# Patient Record
Sex: Male | Born: 2000
Health system: Southern US, Community
[De-identification: ages and names within clinical notes are randomized; demographics above are authoritative.]

## PROBLEM LIST (undated history)

## (undated) DIAGNOSIS — R6252 Short stature (child): Secondary | ICD-10-CM

## (undated) DIAGNOSIS — Z9109 Other allergy status, other than to drugs and biological substances: Secondary | ICD-10-CM

## (undated) DIAGNOSIS — E0500 Thyrotoxicosis with diffuse goiter without thyrotoxic crisis or storm: Secondary | ICD-10-CM

## (undated) DIAGNOSIS — F909 Attention-deficit hyperactivity disorder, unspecified type: Secondary | ICD-10-CM

## (undated) DIAGNOSIS — J45909 Unspecified asthma, uncomplicated: Secondary | ICD-10-CM

## (undated) DIAGNOSIS — E063 Autoimmune thyroiditis: Secondary | ICD-10-CM

## (undated) DIAGNOSIS — R000 Tachycardia, unspecified: Secondary | ICD-10-CM

## (undated) HISTORY — DX: Attention-deficit hyperactivity disorder, unspecified type: F90.9

## (undated) HISTORY — DX: Short stature (child): R62.52

## (undated) HISTORY — DX: Tachycardia, unspecified: R00.0

## (undated) HISTORY — DX: Thyrotoxicosis with diffuse goiter without thyrotoxic crisis or storm: E05.00

## (undated) HISTORY — DX: Unspecified asthma, uncomplicated: J45.909

## (undated) HISTORY — DX: Autoimmune thyroiditis: E06.3

## (undated) HISTORY — PX: OTHER SURGICAL HISTORY: SHX169

## (undated) HISTORY — DX: Other allergy status, other than to drugs and biological substances: Z91.09

---
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
## 2019-01-16 ENCOUNTER — Telehealth (INDEPENDENT_AMBULATORY_CARE_PROVIDER_SITE_OTHER): Payer: Self-pay | Admitting: "Endocrinology

## 2019-01-16 NOTE — Telephone Encounter (Signed)
  Who's calling (name and relationship to patient) : Self  Best contact number: [phone]  Provider they see: Fransico Michael  Reason for call: Would like to talk with Dr. Fransico Michael regarding his diagnosis and possibly joining the [employer].      PRESCRIPTION REFILL ONLY  Name of prescription:  Pharmacy:

## 2019-01-16 NOTE — Telephone Encounter (Signed)
Routed to provider

## 2019-01-23 ENCOUNTER — Encounter (INDEPENDENT_AMBULATORY_CARE_PROVIDER_SITE_OTHER): Payer: Self-pay | Admitting: "Endocrinology

## 2019-01-23 ENCOUNTER — Other Ambulatory Visit: Payer: Self-pay

## 2019-01-23 ENCOUNTER — Ambulatory Visit (INDEPENDENT_AMBULATORY_CARE_PROVIDER_SITE_OTHER): Payer: Medicaid Other | Admitting: "Endocrinology

## 2019-01-23 VITALS — BP 134/88 | HR 98 | Resp 20 | Ht 71.65 in | Wt 253.6 lb

## 2019-01-23 DIAGNOSIS — I10 Essential (primary) hypertension: Secondary | ICD-10-CM

## 2019-01-23 DIAGNOSIS — E0500 Thyrotoxicosis with diffuse goiter without thyrotoxic crisis or storm: Secondary | ICD-10-CM

## 2019-01-23 DIAGNOSIS — R7309 Other abnormal glucose: Secondary | ICD-10-CM | POA: Diagnosis not present

## 2019-01-23 DIAGNOSIS — R7303 Prediabetes: Secondary | ICD-10-CM | POA: Diagnosis not present

## 2019-01-23 DIAGNOSIS — E063 Autoimmune thyroiditis: Secondary | ICD-10-CM

## 2019-01-23 LAB — POCT GLUCOSE (DEVICE FOR HOME USE): Glucose Fasting, POC: 109 mg/dL — AB (ref 70–99)

## 2019-01-23 LAB — POCT GLYCOSYLATED HEMOGLOBIN (HGB A1C): Hemoglobin A1C: 5.3 % (ref 4.0–5.6)

## 2019-01-23 NOTE — Patient Instructions (Addendum)
Follow up visit in 4 months. Please repeat lab tests 1-2 weeks prior. 

## 2019-01-23 NOTE — Progress Notes (Signed)
Subjective:  Patient Name: David Campbell Date of Birth: December 14, 2000  MRN: [id number]  David Campbell  presents at his clinic visit today for follow-up evaluation and management of his Graves' disease, Hashimoto's disease, linear growth delay, obesity, hypertension, acanthosis nigricans, dyspepsia, prediabetes, and gynecomastia.  HISTORY OF PRESENT ILLNESS:   David Campbell is a 19 y.o. African-American young man.  David Campbell was accompanied by his lady friend.    1. David Campbell was first seen in our clinic on 01/21/04 for evaluation and management of hyperthyroidism. He was then 19 years old. David Campbell had presented to Dr. Donnie Coffinubin previously with a history of diarrhea and increased heart rate beginning in October 2005. The child's activity level had been very high and he had been having night sweats. Mother  noted a goiter in the preceding week. On 01/18/04 the TSH was 0.004. Free T4 was greater than 12. CMP and CBC were normal. David Campbell was started on methimazole (MTZ), 5 mg per day. His TSI was positive as were his TPO antibody and thyroglobulin antibody, indicating that he had both Graves' disease and Hashimoto's disease.     2. Since then, Nollan's TFTs have fluctuated back and forth between hyperthyroidism and hypothyroidism. His methimazole doses have varied between 2.5-25 mg/day. Although his Hashimoto's disease has generally been clinically quiescent, his Hashimoto's T cells have gradually destroyed some of his thyrocytes over time and we have been able to gradually taper the methimazole dose over time as well. Unfortunately, he has also had several recurrences of increased TSI levels and worsening hyperthyroidism as well.   3. Josey's last [facility] visit was on 09/22/18. At that visit I continued his MTZ doses of 10 mg in the mornings and 5 mg in the evenings.   A. In the interim he has been healthy. He still has some pains in the right TMJ area at times.   B. He says that he has been eating somewhat  healthier and eating less. He is doing a lot of his own cooking. He walks more.   C. He remains on the new doses of methimazole as noted above.    4. Pertinent Review of Systems:  Constitutional: David Campbell feels "good". His allergies have not been bothering him much during the Winter. His energy level is "normal". He has enough energy to do all the things he wants to do. He no longer gets tired very often. He does not feel either hyper or sluggish. He is sleeping well.  Eyes: Vision seems to be great. There are no recognized eye problems. Neck: There are no recognized problems of the anterior neck.  Heart: There are no recognized heart problems. The ability to perform his usual ADLs and do other physical activities seems normal.  Gastrointestinal: He says that he has less belly hunger. Bowel movents seem normal. There are no other recognized GI problems. Legs: Muscle mass and strength seem normal. He has not had any problems. No edema is noted.  Feet: There are no obvious foot problems. No edema is noted.  Neurologic: There are no recognized problems with muscle movement and strength, sensation, or coordination. Breasts: About the same  Skin: No pruritus  PAST MEDICAL, FAMILY, AND SOCIAL HISTORY  Past Medical History:  Diagnosis Date  . ADHD (attention deficit hyperactivity disorder)   . Asthma, chronic   . Delayed linear growth   . Environmental allergies   . Graves disease   . Hashimoto's disease   . Tachycardia   . Thyrotoxicosis with diffuse goiter  Family History  Problem Relation Age of Onset  . Thyroid disease Mother        Mother had Graves' disease at age 36. She was treated with PTU for 4 years, then went into remission without medication.  [name] Heart disease Maternal Grandmother   . Thyroid disease Maternal Grandfather        Maternal grandfather had Graves' disease for which he was treated with partial thyroidectomy. When recurrence of Graves' disease occurred, he received  high-131 therapy  . Diabetes Paternal Grandmother      Current Outpatient Medications:  .  cetirizine (ZYRTEC) 10 MG tablet, Take 10 mg by mouth daily., Disp: , Rfl:  .  fluticasone (FLONASE) 50 MCG/ACT nasal spray, Place 2 sprays into the nose daily. Reported on 03/05/2015, Disp: , Rfl:  .  methimazole (TAPAZOLE) 5 MG tablet, 5mg  2 x a day on even days and 10 mg in morning  5mg  in evening on the odd days, Disp: 68 tablet, Rfl: 2 .  PROVENTIL HFA 108 (90 Base) MCG/ACT inhaler, INHALE 2 PUFFS Q 4-6 H PRN, Disp: , Rfl: 0  Allergies as of 01/23/2019 - Review Complete 01/23/2019  Allergen Reaction Noted  . Food  10/19/2012     reports that he has never smoked. He has never used smokeless tobacco. He reports that he does not drink alcohol or use drugs. Pediatric History  Patient Parents/Guardians  . [name],[name] (Mother/Guardian)   Other Topics Concern  . Not on file  Social History Narrative   Rabon is planning to join the [employer] in June of this year.    1. School and family: He graduated from high school on June 5th 2020. He is looking for work now. He still wants to join the [employer]. His dad works for [employer]. He wants to work in the Dietitian.  2. Activities: He is doing a lot of walking. He still does some push ups, but no weight-lifting.  3. Primary Care Provider: Karleen Dolphin, MD  REVIEW OF SYSTEMS: There are no other significant problems involving Nuel's other body systems.   Objective:  Vital Signs:  BP 134/88   Pulse 98   Resp 20   Ht 5' 11.65" (1.82 m)   Wt 253 lb 9.6 oz (115 kg)   BMI 34.73 kg/m     Ht Readings from Last 3 Encounters:  01/23/19 5' 11.65" (1.82 m) (77 %, Z= 0.75)*  09/22/18 5' 11.54" (1.817 m) (76 %, Z= 0.72)*  02/02/18 5' 11.1" (1.806 m) (73 %, Z= 0.60)*   * Growth percentiles are based on CDC (Boys, 2-20 Years) data.   Wt Readings from Last 3 Encounters:  01/23/19 253 lb 9.6 oz (115 kg) (>99 %, Z= 2.47)*  09/22/18 255  lb 6.4 oz (115.8 kg) (>99 %, Z= 2.50)*  02/02/18 264 lb (119.7 kg) (>99 %, Z= 2.64)*   * Growth percentiles are based on CDC (Boys, 2-20 Years) data.   HC Readings from Last 3 Encounters:  No data found for [facility]   Body surface area is 2.41 meters squared.  77 %ile (Z= 0.75) based on CDC (Boys, 2-20 Years) Stature-for-age data based on Stature recorded on 01/23/2019. >99 %ile (Z= 2.47) based on CDC (Boys, 2-20 Years) weight-for-age data using vitals from 01/23/2019. No head circumference on file for this encounter.  PHYSICAL EXAM:  Constitutional: Codie appears healthy, but still obese. He has lost 2 additional pounds since his last visit. He is alert and bright. His  affect and insight are normal. He is very Teacher, English as a foreign language. I always enjoy seeing him.  Eyes: There is no arcus or proptosis.  Mouth: The oropharynx appears normal. The tongue appears normal. There is normal oral moisture. There is no obvious gingivitis. He has a trace tongue tremor.  Neck: There are no bruits present. His anterior neck appears enlarged. The thyroid gland is enlarged, but a bit smaller, at about 21-22 grams in size. The right lobe is smaller. The left lobe is larger.  The consistency of the thyroid gland is relatively full on the left, but normal on the right. There is no thyroid tenderness to palpation. Lungs: The lungs are clear. Air movement is good. Heart: The heart rhythm and rate appear normal. Heart sounds S1 and S2 are normal. I do not appreciate any pathologic heart murmurs. Abdomen: The abdominal size is enlarged. Bowel sounds are normal. The abdomen is soft and non-tender. There is no obviously palpable hepatomegaly, splenomegaly, or other masses.  Arms: Muscle mass appears appropriate for age.  Hands: There is a trace tremor. Phalangeal and metacarpophalangeal joints appear normal. Palms are normal. Legs: Muscle mass appears appropriate for age. There is no edema.  Neurologic: Muscle strength is normal for age  and gender  in both the upper and the lower extremities. Muscle tone appears normal. Sensation to touch is normal in the legs. Breast tissue: Breasts are fatty, but are now only Tanner stage I; Areolae measure 43 mm on the left and 40 mm on the right.       LAB DATA: Results for orders placed or performed in visit on 01/23/19 (from the past 504 hour(s))  POCT Glucose (Device for Home Use)   Collection Time: 01/23/19 10:33 AM  Result Value Ref Range   Glucose Fasting, POC 109 (A) 70 - 99 mg/dL   POC Glucose    POCT HgB A1C   Collection Time: 01/23/19 10:33 AM  Result Value Ref Range   Hemoglobin A1C 5.3 4.0 - 5.6 %   HbA1c POC (<> result, manual entry)     HbA1c, POC (prediabetic range)     HbA1c, POC (controlled diabetic range)      Labs 01/23/19: HbA1c 5.3%, CBG 109  Labs 09/05/08: TSH 0.28, free T4 1.5, free T3 4.2, TSI 178  Labs 02/02/18: HbA1c 5.5%, CBG 103  Labs 01/31/18: TSH 1.59, free T4 1.3, free T3 3.5, TSI 191  Labs 08/24/17: HbA1c 5.5%, CBG 81  Labs 08/19/17: TSH 1.66, free T4 1.4, free T3 3.8, TSI 189  Labs 04/12/17: HbA1c 5.5%, CBG 115  Labs 04/08/17: TSH 1.94, free T4 1.5, free T3 4.1, TSI 454  Labs 01/20/17: TSH 2.51, free T4 1.4, free T3 3.6, TSI 480  Labs 10/20/16: HbA1c 5.4%, CBG 113  Labs 09/19/16: TSH 2.58, free T4 1.4, free T3 3.8, TSI 213  Labs 06/22/16: HbA1c 5.3%, CBG 91  Labs 06/17/16: TSH 1.11, free T4 1.4, free T3 4.1, TSI 286 (ref <140, actually <100)  Labs 03/13/16: HbA1c 5.7%; TSH 1.41, free T4 1.2, free T3 3.5, TSI 349  Labs 12/16/15: HbA1c 5.7%  Labs 12/11/15: TSH 3.37, free T4 1.4, free T3 4.1  Labs 07/24/15: HbA1c 5.5%  Labs 05/29/15: TSH 0.23, free T4 1.2, free T3 3.7; TSI 477; CMP normal; CBC normal except for MCV 77.2, iron 106  Labs 04/11/15: HbA1c 5.4%; WBC 6.3, RBC 5.63 (normal 3.80-5.20), hemoglobin 14.3, hematocrit 42.4 %, MCV 75.3 (normal 77-95), neutrophils 3.0; CMP normal with AST 21 and ALT 27; TSH <  0.01, free T4 1.4, free T3 4.8,  TSI 481  Labs 03/05/15: CBG 125, HbA1c 5.2%  Labs 02/28/15: TSH <0.01, free T4 2.1, free T3 9.3, TSI 434; testosterone 217, estradiol 32  Labs 08/30/14: HbA1c 5.3%  Labs 08/24/14: TSH 1.756, free T4 1.40, free T3 4.1, TSI 135; testosterone 187, estradiol 27.8  Labs 05/15/14: TSH 1.247, free T4 1.18, free T3 4.3, TSI 120 (normal by lab is < 140); testosterone 234, estradiol 19.8; HbA1c 5.7%  Labs 12/21/13: HbA1c 5.4%, compared to 5.2% at last visit.  Labs 12/14/13:  TSH 1.063, free T4 1.02, free T3 4.1,TSI 123; testosterone 176, estradiol 13.9  Labs 07/04/13: TSH 1.056, free T4 1.22, free T3 4.5, TSI 60; testosterone 95, estradiol < 11.8  Labs 01/27/12: TSH 2.769, free T4 1.28, free T3 3.7; testosterone 54, estradiol < 11.8  Labs 09/08/12: [facility] can't find the lab results. Loney Loh has no record of any labs done since June.  Labs 07/01/12: TSH 1.424, free T4 1.33, free T3 3.8  Labs 02/23/12: TSH 1.999, free T4 1.26, free T3 3.7, IGF-1 pending, IGFBP-3 pending   Assessment and Plan:   ASSESSMENT:  1-2. Thyrotoxicosis with diffuse goiter, secondary to Graves disease/Hashimoto's thyroiditis.:   A. The goiter was a bit more enlarged at his visit in January 2020, but we didn't know whether this mild enlargement was due to Graves' disease, Hashimoto's disease, or combination of the two diseases. When his TSI result came back, it was obvious that he still had active Graves' disease.        Fredirick Lathe was euthyroid in January at about the 49% of the normal thyroid hormone range on his current MTZ doses. Given the fact that he might be moving out of the area after graduation, it was reasonable to try to resume tapering his MTZ doses.   C. The pattern of all three TFTs increasing in parallel together that we saw from December 2015 to May 2016 and again from May to November 2017 was c/w recent flare ups of Hashimoto's thyroiditis.  His Hashimoto's T lymphocytes are still trying to destroy more thyrocytes.  His thyroiditis was clinically quiescent in January 2020 and again today.  D. In August 2020, his free T4 and free T3 were higher and his TSH was low, all c/w a flare up of Graves disease and resulting hyperthyroidism, so I increased the MTZ dose by 50%. He is clinically nearly euthyroid today.   E. The battle between his Graves' Dz B lymphocytes and his Hashimoto's Dz T lymphocytes continues, but has gradually improved over time. In all likelihood, the T cells will eventually win out. Unfortunately his Luiz Blare' disease was still dominant in August 2020, so he needs to continue his MTZ regimen. Although his TSI level in September 2018 was the lowest that it had been since February 2017, in January 2019 the TSI level more than doubled. In August 2019 the TSI was still elevated and his TSI level in January and in August 2020 was still elevated.  3. Prediabetes:   A. Since he had had three previous HbA1c values in the prediabetic range, we officially diagnosed him as having prediabetes. However, he then had five HbA1c values that were normal. Unfortunately, as he gained more weight his HbA1c values increased toward the prediabetic range. His January 2020 HbA1c level was again high-normal for his age.     B. It appears that since last visit Coyt has continued to be serious about eating right and about exercising. He  has lost another 2 pounds.   C. His HbA1c today is lower and well within normal limits, c/w his weight loss.   4. Morbid obesity: His weight has decreased more.   5. Large breasts/Gynecomastia: This problem has decreased over time and has now essentially resolved. .    6. Hypertension: His DBP is still elevated. He needs to do more cardio and lose more fat weight. We will consider starting hypertensive medication at his next visit if the BP is not improved.  PLAN:   1. Diagnostic: Will repeat TFTs, TSI, CMP, CBC today.   2. Therapeutic: Continue MTZ doses of 10 mg in the mornings and 5 mg in  the evenings. Reduce caloric drinks. Eat Right. Exercise for an hour or more per day.  We will start BP medication as needed.  3. Patient education: Discussed his recent TFT results, improved weight, and overall thyroid hormone status. Continue to focus on reducing caloric drink intake, consuming less starch and sugar, and exercising for one hour per day or more. We also discussed why he will not be able to enlist in the [employer] if he has active Graves' disease. Davey appreciated my being honest with him about being unable to enlist.  4. Follow-up: 4 months   Level of Service: This visit lasted in excess of 60 minutes. More than 50% of the visit was devoted to counseling.  David Stall, MD, CDE Pediatric and Adult Endocrinology

## 2019-01-26 LAB — CBC WITH DIFFERENTIAL/PLATELET
Absolute Monocytes: 501 cells/uL (ref 200–950)
Basophils Absolute: 50 cells/uL (ref 0–200)
Basophils Relative: 0.9 %
Eosinophils Absolute: 110 cells/uL (ref 15–500)
Eosinophils Relative: 2 %
HCT: 46.6 % (ref 38.5–50.0)
Hemoglobin: 15.4 g/dL (ref 13.2–17.1)
Lymphs Abs: 1865 cells/uL (ref 850–3900)
MCH: 26.5 pg — ABNORMAL LOW (ref 27.0–33.0)
MCHC: 33 g/dL (ref 32.0–36.0)
MCV: 80.2 fL (ref 80.0–100.0)
MPV: 10.5 fL (ref 7.5–12.5)
Monocytes Relative: 9.1 %
Neutro Abs: 2976 cells/uL (ref 1500–7800)
Neutrophils Relative %: 54.1 %
Platelets: 275 10*3/uL (ref 140–400)
RBC: 5.81 10*6/uL — ABNORMAL HIGH (ref 4.20–5.80)
RDW: 13.3 % (ref 11.0–15.0)
Total Lymphocyte: 33.9 %
WBC: 5.5 10*3/uL (ref 3.8–10.8)

## 2019-01-26 LAB — COMPREHENSIVE METABOLIC PANEL
AG Ratio: 1.4 (calc) (ref 1.0–2.5)
ALT: 24 U/L (ref 8–46)
AST: 19 U/L (ref 12–32)
Albumin: 4.2 g/dL (ref 3.6–5.1)
Alkaline phosphatase (APISO): 71 U/L (ref 46–169)
BUN: 15 mg/dL (ref 7–20)
CO2: 29 mmol/L (ref 20–32)
Calcium: 9.7 mg/dL (ref 8.9–10.4)
Chloride: 103 mmol/L (ref 98–110)
Creat: 1.05 mg/dL (ref 0.60–1.26)
Globulin: 3 g/dL (calc) (ref 2.1–3.5)
Glucose, Bld: 95 mg/dL (ref 65–99)
Potassium: 4.4 mmol/L (ref 3.8–5.1)
Sodium: 139 mmol/L (ref 135–146)
Total Bilirubin: 0.6 mg/dL (ref 0.2–1.1)
Total Protein: 7.2 g/dL (ref 6.3–8.2)

## 2019-01-26 LAB — THYROID STIMULATING IMMUNOGLOBULIN: TSI: 213 % baseline — ABNORMAL HIGH (ref <140)

## 2019-01-26 LAB — TSH: TSH: 0.34 mIU/L — ABNORMAL LOW (ref 0.50–4.30)

## 2019-01-26 LAB — T4, FREE: Free T4: 1.4 ng/dL (ref 0.8–1.4)

## 2019-01-26 LAB — T3, FREE: T3, Free: 4.4 pg/mL (ref 3.0–4.7)

## 2019-02-16 ENCOUNTER — Other Ambulatory Visit (INDEPENDENT_AMBULATORY_CARE_PROVIDER_SITE_OTHER): Payer: Self-pay

## 2019-02-16 ENCOUNTER — Telehealth (INDEPENDENT_AMBULATORY_CARE_PROVIDER_SITE_OTHER): Payer: Self-pay

## 2019-02-16 DIAGNOSIS — E0500 Thyrotoxicosis with diffuse goiter without thyrotoxic crisis or storm: Secondary | ICD-10-CM

## 2019-02-16 DIAGNOSIS — E063 Autoimmune thyroiditis: Secondary | ICD-10-CM

## 2019-02-16 MED ORDER — METHIMAZOLE 5 MG PO TABS: ORAL_TABLET | ORAL | 6 refills | Status: DC

## 2019-02-16 NOTE — Telephone Encounter (Signed)
Ronelle called in to the office returning a message I had left for him. I relayed the result note per Dr. Fransico Michael, (Thyroid tests are still hyperthyroid due to his TSI being more elevated. CMP was normal. CBC showed that his RBCs were a bit elevated). He understood results.  I also relayed to him the change in his medication dosage ( methimazole dose, 5 mg tablets, number 120/month, take 2 tablets twice daily,with 6 refills), which he repeated back to me.  I also relayed to him that he needs to come around the first week of March to get labs drawn per Dr. Juluis Mire result note.

## 2019-02-16 NOTE — Telephone Encounter (Signed)
Called and left message on the mobile number ([phone]) for [employer] to call the office back.

## 2019-05-16 ENCOUNTER — Other Ambulatory Visit: Payer: Self-pay

## 2019-05-16 ENCOUNTER — Emergency Department (HOSPITAL_BASED_OUTPATIENT_CLINIC_OR_DEPARTMENT_OTHER)
Admission: EM | Admit: 2019-05-16 | Discharge: 2019-05-16 | Disposition: A | Discharge status: Home / Self Care | Payer: Federal, State, Local not specified - PPO | Attending: Emergency Medicine | Admitting: Emergency Medicine

## 2019-05-16 ENCOUNTER — Emergency Department (HOSPITAL_BASED_OUTPATIENT_CLINIC_OR_DEPARTMENT_OTHER): Payer: Federal, State, Local not specified - PPO

## 2019-05-16 ENCOUNTER — Encounter (HOSPITAL_BASED_OUTPATIENT_CLINIC_OR_DEPARTMENT_OTHER): Payer: Self-pay | Admitting: *Deleted

## 2019-05-16 DIAGNOSIS — J45909 Unspecified asthma, uncomplicated: Secondary | ICD-10-CM | POA: Insufficient documentation

## 2019-05-16 DIAGNOSIS — Z79899 Other long term (current) drug therapy: Secondary | ICD-10-CM | POA: Diagnosis not present

## 2019-05-16 DIAGNOSIS — R0789 Other chest pain: Secondary | ICD-10-CM | POA: Diagnosis present

## 2019-05-16 LAB — CBC WITH DIFFERENTIAL/PLATELET
Abs Immature Granulocytes: 0.01 10*3/uL (ref 0.00–0.07)
Basophils Absolute: 0 10*3/uL (ref 0.0–0.1)
Basophils Relative: 0 %
Eosinophils Absolute: 0.1 10*3/uL (ref 0.0–0.5)
Eosinophils Relative: 2 %
HCT: 47.3 % (ref 39.0–52.0)
Hemoglobin: 15.4 g/dL (ref 13.0–17.0)
Immature Granulocytes: 0 %
Lymphocytes Relative: 30 %
Lymphs Abs: 2.2 10*3/uL (ref 0.7–4.0)
MCH: 26.1 pg (ref 26.0–34.0)
MCHC: 32.6 g/dL (ref 30.0–36.0)
MCV: 80 fL (ref 80.0–100.0)
Monocytes Absolute: 0.8 10*3/uL (ref 0.1–1.0)
Monocytes Relative: 11 %
Neutro Abs: 4.3 10*3/uL (ref 1.7–7.7)
Neutrophils Relative %: 57 %
Platelets: 263 10*3/uL (ref 150–400)
RBC: 5.91 MIL/uL — ABNORMAL HIGH (ref 4.22–5.81)
RDW: 12.6 % (ref 11.5–15.5)
WBC: 7.5 10*3/uL (ref 4.0–10.5)
nRBC: 0 % (ref 0.0–0.2)

## 2019-05-16 LAB — BASIC METABOLIC PANEL
Anion gap: 10 (ref 5–15)
BUN: 19 mg/dL (ref 6–20)
CO2: 22 mmol/L (ref 22–32)
Calcium: 9 mg/dL (ref 8.9–10.3)
Chloride: 106 mmol/L (ref 98–111)
Creatinine, Ser: 0.97 mg/dL (ref 0.61–1.24)
GFR calc Af Amer: >60 mL/min (ref >60)
GFR calc non Af Amer: >60 mL/min (ref >60)
Glucose, Bld: 89 mg/dL (ref 70–99)
Potassium: 4 mmol/L (ref 3.5–5.1)
Sodium: 138 mmol/L (ref 135–145)

## 2019-05-16 MED ORDER — METHOCARBAMOL 500 MG PO TABS: 500.0000 mg | ORAL_TABLET | Freq: Two times a day (BID) | ORAL | 0 refills | Status: DC

## 2019-05-16 NOTE — Discharge Instructions (Signed)
Please take the muscle relaxer at night and do not drink and drive on this medication. We have drawn your TSH but this may take several days to come back. If it is abnormal we will give you a call. Return to the ER if your symptoms worsen.

## 2019-05-16 NOTE — ED Triage Notes (Signed)
He had a muscle spasm in the right side of his chest while at work today. States he feels SOB during the spasm. He has been having this feeling off and on for a week.

## 2019-05-17 LAB — TSH: TSH: <0.01 u[IU]/mL — ABNORMAL LOW (ref 0.350–4.500)

## 2019-05-17 NOTE — ED Provider Notes (Signed)
[name] EMERGENCY DEPARTMENT Provider Note   CSN: [id number] Arrival date & time: 05/16/19  1730     History Chief Complaint  Patient presents with  . Chest Pain    David Campbell is a 19 y.o. male.  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  Patient has been having on and off muscle spasms in his right chest over the last week.  He states he has never had shortness of breath like this before which made him concerned and brought him to the ER.  He denies any radiating chest pain, tachycardia, diaphoresis, dizziness, syncope, back pain, tremors, nausea, vomiting, abdominal pain, fevers, chills.  He states has been compliant with his methimazole.     Past Medical History:  Diagnosis Date  . ADHD (attention deficit hyperactivity disorder)   . Asthma, chronic   . Delayed linear growth   . Environmental allergies   . Graves disease   . Hashimoto's disease   . Tachycardia   . Thyrotoxicosis with diffuse goiter     Patient Active Problem List   Diagnosis Date Noted  . Prediabetes 09/05/2014  . Elevated hemoglobin A1c 07/10/2013  . 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 Gynecomastia, male 10/19/2011  . Goiter 07/10/2011  . Graves disease 03/16/2011  . Thyrotoxicosis with diffuse goiter   . Hashimoto's disease   . Tachycardia   . ADHD (attention deficit hyperactivity disorder)   . Environmental allergies   . Asthma, chronic   . Delayed linear growth     Past Surgical History:  Procedure Laterality Date  . None         Family History  Problem Relation Age of Onset  . Thyroid disease Mother        Mother had Graves' disease at age 13. She was treated with PTU for 4  years, then went into remission without medication.  [name] Heart disease Maternal Grandmother   . Thyroid disease Maternal Grandfather        Maternal grandfather had Graves' disease for which he was treated with partial thyroidectomy. When recurrence of Graves' disease occurred, he received high-131 therapy  . Diabetes Paternal Grandmother     Social History   Tobacco Use  . Smoking status: Never Smoker  . Smokeless tobacco: Never Used  Substance Use Topics  . Alcohol use: No  . Drug use: No    Home Medications Prior to Admission medications   Medication Sig Start Date End Date Taking? Authorizing Provider  cetirizine (ZYRTEC) 10 MG tablet Take 10 mg by mouth daily.   Yes [provider]  fluticasone (FLONASE) 50 MCG/ACT nasal spray Place 2 sprays into the nose daily. Reported on 03/05/2015   Yes [provider]  methimazole (TAPAZOLE) 5 MG tablet Two 5mg  tablets two times a day 02/16/19  Yes Sherrlyn Hock, MD  methocarbamol (ROBAXIN) 500 MG tablet Take 1 tablet (500 mg total) by mouth 2 (two) times daily. 05/16/19   Garald Balding, PA-C  [facility] 108 [phone] Base) MCG/ACT inhaler INHALE 2 PUFFS Q 4-6 H PRN 03/09/17   [provider]    Allergies    Food  Review of Systems   Review of Systems  Constitutional: Negative for chills and fever.  Respiratory:  Positive for chest tightness and shortness of breath. Negative for cough and choking.   Cardiovascular: Positive for chest pain. Negative for palpitations and leg swelling.  Gastrointestinal: Negative for nausea and vomiting.  Musculoskeletal: Negative for neck pain and neck stiffness.  Skin: Negative for rash.  Neurological: Negative for dizziness, syncope, weakness and headaches.  All other systems reviewed and are negative.   Physical Exam Updated Vital Signs BP 134/70   Pulse 93   Temp 98.2 F (36.8 C) (Oral)   Resp 17   Ht 6' (1.829 m)   Wt 115 kg   SpO2 98%   BMI 34.38 kg/m    Physical Exam Vitals and nursing note reviewed.  Constitutional:      General: He is not in acute distress.    Appearance: He is well-developed. He is obese. He is not ill-appearing, toxic-appearing or diaphoretic.  HENT:     Head: Normocephalic and atraumatic.  Eyes:     Extraocular Movements: Extraocular movements intact.     Conjunctiva/sclera: Conjunctivae normal.     Pupils: Pupils are equal, round, and reactive to light.  Neck:     Thyroid: No thyromegaly.     Trachea: No tracheal deviation.  Cardiovascular:     Rate and Rhythm: Normal rate and regular rhythm.     Pulses:          Radial pulses are 2+ on the right side and 2+ on the left side.       Dorsalis pedis pulses are 2+ on the right side and 2+ on the left side.     Heart sounds: Normal heart sounds. Heart sounds not distant. No murmur.  Pulmonary:     Effort: Pulmonary effort is normal. No tachypnea, accessory muscle usage or respiratory distress.     Breath sounds: Normal breath sounds. No stridor. No decreased breath sounds or wheezing.  Chest:     Chest wall: Tenderness present.  Abdominal:     Palpations: Abdomen is soft.     Tenderness: There is no abdominal tenderness.  Musculoskeletal:        General: Normal range of motion.     Cervical back: Normal range of motion and neck supple.     Right lower leg: No tenderness. No edema.     Left lower leg: No tenderness. No edema.  Skin:    General: Skin is warm and dry.     Capillary Refill: Capillary refill takes less than 2 seconds.     Findings: No erythema or rash.  Neurological:     General: No focal deficit present.     Mental Status: He is alert and oriented to person, place, and time.     Cranial Nerves: No cranial nerve deficit.     Motor: No weakness.  Psychiatric:        Mood and Affect: Mood normal.        Behavior: Behavior normal.     ED Results / Procedures / Treatments   Labs (all labs ordered are listed, but only abnormal results are  displayed) Labs Reviewed  CBC WITH DIFFERENTIAL/PLATELET - Abnormal; Notable for the following components:      Result Value   RBC 5.91 (*)    All other components within normal limits  TSH - Abnormal; Notable for the following components:   TSH 0.010 (*)    All other components within normal limits  BASIC METABOLIC PANEL    EKG EKG Interpretation  Date/Time:  Tuesday May 16 2019 17:45:07 EDT Ventricular Rate:  102 PR Interval:    QRS Duration: 91 QT Interval:  342 QTC Calculation: 446 R Axis:   70 Text Interpretation: Sinus tachycardia ST elev, probable normal early repol pattern No previous ECGs available Confirmed by Alvira Monday ([id number]) on 05/16/2019 5:53:27 PM   Radiology DG Chest 2 View  Result Date: 05/16/2019 CLINICAL DATA:  Right upper chest pain EXAM: CHEST - 2 VIEW COMPARISON:  01/27/2004 FINDINGS: The heart size and mediastinal contours are within normal limits. Both lungs are clear. The visualized skeletal structures are unremarkable. IMPRESSION: No active cardiopulmonary disease. Electronically Signed   By: Jasmine Pang M.D.   On: 05/16/2019 19:25    Procedures Procedures (including critical care time)  Medications Ordered in ED Medications - No data to display  ED Course  I have reviewed the triage vital signs and the nursing notes.  Pertinent labs & imaging results that were available during my care of the patient were reviewed by me and considered in my medical decision making (see chart for details).    MDM Rules/Calculators/A&P                     19 year old male with chest wall muscle spasms, episode of shortness of breath that occurred at work earlier today. On presentation to the ER, patient is alert and oriented, no acute distress, nondiaphoretic, without increased work of breathing, speaking full sentences.  Vitals overall reassuring, patient was tachycardic on presentation to the ER with his heart rate as high as 117 and some mild  hypertension, which improved throughout ED course.  He is not currently tachypneic, short of breath.  Chest wall tenderness reproducible on physical exam.  No evidence of tremor, weakness, neuro deficits, or any other evidence of thyrotoxicosis on physical exam.  CBC without leukocytosis, normal hemoglobin.  CMP without significant electrolyte abnormalities or renal dysfunction.  Normal anion gap, no evidence of DKA.  Chest x-ray without acute cardiopulmonary disease.  EKG consistent with sinus tach, no evidence of arrhythmias or ST elevation/T wave depressions.  Patient has no complaints in the ER, states that he is not short of breath or having muscle spasm.  Tachycardia improved throughout ED course, heart rate at discharge was 93, not hypotensive, not tachypneic with normal O2 sats.  Given his history of thyrotoxicosis, I ordered a TSH and discussed with the patient that if this is abnormal he will receive a call.  I prescribed him some Robaxin for his muscle spasms.  Given strict return precautions.  Encouraged to follow-up with PCP.  Patient voiced understanding was agreeable to the plan.  Discussed the case with Dr. Dalene Seltzer and she was agreeable to this plan.  After discharge, TSH came back as 0.010 which appears to be downtrending from his value from his last visit.  He does have evidence of possible hyperthyroidism, but doubt thyrotoxicosis in the ER.  Patient stated that he was compliant with his methimazole.  I will call the patient to inform him of this abnormal lab value, give return precautions, and directed him to follow-up with his endocrinologist tomorrow morning.  I have also messaged Dr. Fransico Michael and his LPN [place] to inform them of this as well.     Final Clinical Impression(s) / ED Diagnoses Final diagnoses:  Chest wall pain    Rx / DC Orders ED Discharge Orders         Ordered    methocarbamol (ROBAXIN) 500 MG tablet  2 times  daily     05/16/19 2002           Leone Brand 05/17/19 Burnett Harry, MD 05/17/19 [phone]

## 2019-05-18 ENCOUNTER — Telehealth: Payer: Self-pay | Admitting: "Endocrinology

## 2019-05-18 NOTE — Telephone Encounter (Signed)
1. I received a message from the PA at the ED. He went to the ED on 05/16/19 due to having chest pain. He was tachycardic. His TSH was low at <0.01. 2. When I called him, he told me that he had only been taking 10 mg of methimazole in the mornings and 5 mg in the evenings, compared with 10, twice daily that was supposed to be taking. 3. I asked him to take the 10 mg, twice daily and repeat his TFTs in one month. He agreed. Molli Knock, MD, CDE

## 2019-12-28 ENCOUNTER — Encounter (INDEPENDENT_AMBULATORY_CARE_PROVIDER_SITE_OTHER): Payer: Self-pay | Admitting: "Endocrinology

## 2019-12-28 ENCOUNTER — Other Ambulatory Visit: Payer: Self-pay

## 2019-12-28 ENCOUNTER — Ambulatory Visit (INDEPENDENT_AMBULATORY_CARE_PROVIDER_SITE_OTHER): Payer: Medicaid Other | Admitting: "Endocrinology

## 2019-12-28 VITALS — BP 110/68 | HR 84 | Wt 255.8 lb

## 2019-12-28 DIAGNOSIS — R7303 Prediabetes: Secondary | ICD-10-CM

## 2019-12-28 DIAGNOSIS — E0500 Thyrotoxicosis with diffuse goiter without thyrotoxic crisis or storm: Secondary | ICD-10-CM | POA: Diagnosis not present

## 2019-12-28 DIAGNOSIS — R7309 Other abnormal glucose: Secondary | ICD-10-CM

## 2019-12-28 DIAGNOSIS — I10 Essential (primary) hypertension: Secondary | ICD-10-CM

## 2019-12-28 LAB — POCT GLYCOSYLATED HEMOGLOBIN (HGB A1C): Hemoglobin A1C: 5.2 % (ref 4.0–5.6)

## 2019-12-28 LAB — POCT GLUCOSE (DEVICE FOR HOME USE): Glucose Fasting, POC: 102 mg/dL — AB (ref 70–99)

## 2019-12-28 LAB — T4, FREE: Free T4: 1.5 ng/dL — ABNORMAL HIGH (ref 0.8–1.4)

## 2019-12-28 LAB — TSH: TSH: 0.01 mIU/L — ABNORMAL LOW (ref 0.40–4.50)

## 2019-12-28 LAB — T3, FREE: T3, Free: 4.7 pg/mL (ref 3.0–4.7)

## 2019-12-28 LAB — THYROID STIMULATING IMMUNOGLOBULIN: TSI: 141 % baseline — ABNORMAL HIGH (ref <140)

## 2019-12-28 MED ORDER — METHIMAZOLE 10 MG PO TABS: 20.0000 mg | ORAL_TABLET | Freq: Two times a day (BID) | ORAL | 11 refills | Status: DC

## 2019-12-28 NOTE — Patient Instructions (Signed)
Follow up visit in two months Please repeat lab tests in 6 week.

## 2019-12-28 NOTE — Progress Notes (Signed)
Subjective:  Patient Name: David Campbell Date of Birth: 2000-09-15  MRN: [id number]  David Campbell  presents at his clinic visit today for follow-up evaluation and management of his Graves' disease, Hashimoto's disease, linear growth delay, obesity, hypertension, acanthosis nigricans, dyspepsia, prediabetes, and gynecomastia.  HISTORY OF PRESENT ILLNESS:   David Campbell is a 19 y.o. African-American young man.  Alfonzo was unaccompanied.    1. Breyson was first seen in our clinic on 01/21/04 for evaluation and management of hyperthyroidism. He was then 19 years old. Daniil had presented to Dr. Donnie Coffin previously with a history of diarrhea and increased heart rate beginning in October 2005. The child's activity level had been very high and he had been having night sweats. Mother  noted a goiter in the preceding week. On 01/18/04 the TSH was 0.004. Free T4 was greater than 12. CMP and CBC were normal. Decoda was started on methimazole (MTZ), 5 mg per day. His TSI was positive as were his TPO antibody and thyroglobulin antibody, indicating that he had both Graves' disease and Hashimoto's disease.     2. Since then, David Campbell's TFTs have fluctuated back and forth between hyperthyroidism and hypothyroidism. His methimazole doses have varied between 2.5-25 mg/day. Although his Hashimoto's disease has generally been clinically quiescent, his Hashimoto's T cells have gradually destroyed some of his thyrocytes over time and we have been able to gradually taper the methimazole dose over time as well. Unfortunately, he has also had several recurrences of increased TSI levels and worsening hyperthyroidism as well.   3. David Campbell's last [facility] visit was on 01/23/19. At that visit I continued his MTZ doses of 10 mg in the mornings and 5 mg in the evenings. However, after reviewing his lab results, I wanted to increase his MTZ dose to 10 mg, twice daily. Because the home phone number was not working, a letter was  sent to his home. In May, however, when he went to the hospital with chest pain, his TSH was suppressed. I called him and discovered that he had not increased the MTZ dose, I asked him to take the 10 mg of MTZ twice daily and repeat his lab tests in one month. He did not repeat the lab tests until this week.   A. In the interim he has been healthy.   B. He no longer has pains in the right TMJ area at times. He has been having intermittent pains in his thyroid gland.   C. He says that he has been eating somewhat healthier and eating less. He is doing a lot of his own cooking. He walks a lot.   D. He remains on the new doses of methimazole as noted above.    4. Pertinent Review of Systems:  Constitutional: David Campbell feels "good". His allergies have not been bothering him much during the Winter. His energy level is "normal". He has enough energy to do all the things he wants to do. He no longer gets tired very often. He sometimes feels both hyper and sluggish. He is sleeping well.  Eyes: Vision seems to be great. There are no recognized eye problems. Neck: As above. There are more recognized symptoms of the anterior neck.  Heart: There are no recognized heart problems. The ability to perform his usual ADLs and do other physical activities seems normal.  Gastrointestinal: He says that he has less belly hunger. Bowel movents seem normal. There are no other recognized GI problems. Hands: no problems Legs: Muscle mass and strength seem normal.  He has not had any problems. No edema is noted.  Feet: There are no obvious foot problems. No edema is noted.  Neurologic: There are no recognized problems with muscle movement and strength, sensation, or coordination. Breasts: About the same  Skin: No pruritus  PAST MEDICAL, FAMILY, AND SOCIAL HISTORY  Past Medical History:  Diagnosis Date  . ADHD (attention deficit hyperactivity disorder)   . Asthma, chronic   . Delayed linear growth   . Environmental  allergies   . Graves disease   . Hashimoto's disease   . Tachycardia   . Thyrotoxicosis with diffuse goiter     Family History  Problem Relation Age of Onset  . Thyroid disease Mother        Mother had Graves' disease at age 19. She was treated with PTU for 4 years, then went into [id number] without medication.  David Campbell. Heart disease Maternal Grandmother   . Thyroid disease Maternal Grandfather        Maternal grandfather had Graves' disease for which he was treated with partial thyroidectomy. When recurrence of Graves' disease occurred, he received high-131 therapy  . Diabetes Paternal Grandmother      Current Outpatient Medications:  .  methimazole (TAPAZOLE) 5 MG tablet, Two 5mg  tablets two times a day, Disp: 120 tablet, Rfl: 6 .  cetirizine (ZYRTEC) 10 MG tablet, Take 10 mg by mouth daily. (Patient not taking: Reported on 12/28/2019), Disp: , Rfl:  .  fluticasone (FLONASE) 50 MCG/ACT nasal spray, Place 2 sprays into the nose daily. Reported on 03/05/2015 (Patient not taking: Reported on 12/28/2019), Disp: , Rfl:  .  methocarbamol (ROBAXIN) 500 MG tablet, Take 1 tablet (500 mg total) by mouth 2 (two) times daily. (Patient not taking: Reported on 12/28/2019), Disp: 20 tablet, Rfl: 0 .  PROVENTIL HFA 108 (90 Base) MCG/ACT inhaler, INHALE 2 PUFFS Q 4-6 H PRN (Patient not taking: Reported on 12/28/2019), Disp: , Rfl: 0  Allergies as of 12/28/2019 - Review Complete 12/28/2019  Allergen Reaction Noted  . Food  10/19/2012     reports that he has never smoked. He has never used smokeless tobacco. He reports that he does not drink alcohol and does not use drugs.  1. School and family: He graduated from high school on June 5th 2020. 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 .   2. Activities: He is doing a lot of walking. He still does some push ups, but no weight-lifting.  3. Primary Care Provider: Maryellen Pileubin, David, MD  REVIEW OF SYSTEMS: There are no other significant  problems involving David Campbell's other body systems.   Objective:  Vital Signs:  BP 110/68   Pulse 84   Wt 255 lb 12.8 oz (116 kg)   BMI 34.69 kg/m     Ht Readings from Last 3 Encounters:  05/16/19 6' (1.829 m) (81 %, Z= 0.86)*  01/23/19 5' 11.65" (1.82 m) (77 %, Z= 0.75)*  09/22/18 5' 11.54" (1.817 m) (76 %, Z= 0.72)*   * Growth percentiles are based on CDC (Boys, 2-20 Years) data.   Wt Readings from Last 3 Encounters:  12/28/19 255 lb 12.8 oz (116 kg)  05/16/19 253 lb 8.5 oz (115 kg) (>99 %, Z= 2.47)*  01/23/19 253 lb 9.6 oz (115 kg) (>99 %, Z= 2.47)*   * Growth percentiles are based on CDC (Boys, 2-20 Years) data.   HC Readings from Last 3 Encounters:  [facility]   Body surface  area is 2.43 meters squared.  Facility age limit for growth percentiles is 20 years. Facility age limit for growth percentiles is 20 years. Facility age limit for growth percentiles is 20 years.  PHYSICAL EXAM:  Constitutional: Kylie appears healthy, but still obese. He has gained 2 additional pounds since his last visit. He is alert and bright. His affect and insight are normal. He is very Teacher, English as a foreign language. I always enjoy seeing him.  Eyes: There is no arcus or proptosis.  Mouth: The oropharynx appears normal. The tongue appears normal. There is normal oral moisture. There is no obvious gingivitis. He has no tongue tremor.  Neck: There are no bruits present. His anterior neck appears enlarged. The thyroid gland is more enlarged, and diffusely enlarged, at about 22+ grams in size.  The consistency of the thyroid gland is relatively full. There is no thyroid tenderness to palpation. Lungs: The lungs are clear. Air movement is good. Heart: The heart rhythm and rate appear normal. Heart sounds S1 and S2 are normal. I do not appreciate any pathologic heart murmurs. Abdomen: The abdomen is enlarged. Bowel sounds are normal. The abdomen is soft and non-tender. There is no obviously palpable hepatomegaly,  splenomegaly, or other masses.  Arms: Muscle mass appears appropriate for age.  Hands: There is no tremor. Phalangeal and metacarpophalangeal joints appear normal. Palms are normal. Legs: Muscle mass appears appropriate for age. There is no edema.  Neurologic: Muscle strength is normal for age and gender  in both the upper and the lower extremities. Muscle tone appears normal. Sensation to touch is normal in the legs. Breast tissue: Breasts are fatty, but are now only Tanner stage I; Areolae measure 45 mm on the right and 4 mm on the left, compared with 43 mm on the left and 40 mm on the right at his last visit. David Campbell  LAB DATA: Results for orders placed or performed in visit on 12/28/19 (from the past 504 hour(s))  POCT Glucose (Device for Home Use)   Collection Time: 12/28/19 10:35 AM  Result Value Ref Range   Glucose Fasting, POC 102 (A) 70 - 99 mg/dL   POC Glucose    Results for orders placed or performed in visit on 01/23/19 (from the past 504 hour(s))  T3, free   Collection Time: 12/26/19  3:20 PM  Result Value Ref Range   T3, Free 4.7 3.0 - 4.7 pg/mL  T4, free   Collection Time: 12/26/19  3:20 PM  Result Value Ref Range   Free T4 1.5 (H) 0.8 - 1.4 ng/dL  TSH   Collection Time: 12/26/19  3:20 PM  Result Value Ref Range   TSH 0.01 (L) 0.40 - 4.50 mIU/L     Labs 12/28/19: HbA1c 5.2%, CBG 102  Labs 12/26/19: TSH 0.01, free T4 1.5, free T3 4.7, TSI pending  Labs 05/16/19: TSH <0.010  Labs 01/23/19: HbA1c 5.3%, CBG 109; TSH 0.34, free T4 1.4, free T3 4.4, TSI 213; CMP normal; CBC normal, except his RBC count was slightly elevated at 5.81 (ref 4.20-5.80)  Labs 09/05/08: TSH 0.28, free T4 1.5, free T3 4.2, TSI 178  Labs 02/02/18: HbA1c 5.5%, CBG 103  Labs 01/31/18: TSH 1.59, free T4 1.3, free T3 3.5, TSI 191  Labs 08/24/17: HbA1c 5.5%, CBG 81  Labs 08/19/17: TSH 1.66, free T4 1.4, free T3 3.8, TSI 189  Labs 04/12/17: HbA1c 5.5%, CBG 115  Labs 04/08/17: TSH 1.94, free T4 1.5, free  T3 4.1, TSI 454  Labs 01/20/17: TSH  2.51, free T4 1.4, free T3 3.6, TSI 480  Labs 10/20/16: HbA1c 5.4%, CBG 113  Labs 09/19/16: TSH 2.58, free T4 1.4, free T3 3.8, TSI 213  Labs 06/22/16: HbA1c 5.3%, CBG 91  Labs 06/17/16: TSH 1.11, free T4 1.4, free T3 4.1, TSI 286 (ref <140, actually <100)  Labs 03/13/16: HbA1c 5.7%; TSH 1.41, free T4 1.2, free T3 3.5, TSI 349  Labs 12/16/15: HbA1c 5.7%  Labs 12/11/15: TSH 3.37, free T4 1.4, free T3 4.1  Labs 07/24/15: HbA1c 5.5%  Labs 05/29/15: TSH 0.23, free T4 1.2, free T3 3.7; TSI 477; CMP normal; CBC normal except for MCV 77.2, iron 106  Labs 04/11/15: HbA1c 5.4%; WBC 6.3, RBC 5.63 (normal 3.80-5.20), hemoglobin 14.3, hematocrit 42.4 %, MCV 75.3 (normal 77-95), neutrophils 3.0; CMP normal with AST 21 and ALT 27; TSH <0.01, free T4 1.4, free T3 4.8, TSI 481  Labs 03/05/15: CBG 125, HbA1c 5.2%  Labs 02/28/15: TSH <0.01, free T4 2.1, free T3 9.3, TSI 434; testosterone 217, estradiol 32  Labs 08/30/14: HbA1c 5.3%  Labs 08/24/14: TSH 1.756, free T4 1.40, free T3 4.1, TSI 135; testosterone 187, estradiol 27.8  Labs 05/15/14: TSH 1.247, free T4 1.18, free T3 4.3, TSI 120 (normal by lab is < 140); testosterone 234, estradiol 19.8; HbA1c 5.7%  Labs 12/21/13: HbA1c 5.4%, compared to 5.2% at last visit.  Labs 12/14/13:  TSH 1.063, free T4 1.02, free T3 4.1,TSI 123; testosterone 176, estradiol 13.9  Labs 07/04/13: TSH 1.056, free T4 1.22, free T3 4.5, TSI 60; testosterone 95, estradiol < 11.8  Labs 01/27/12: TSH 2.769, free T4 1.28, free T3 3.7; testosterone 54, estradiol < 11.8  Labs 09/08/12: [facility] can't find the lab results. Loney Loh has no record of any labs done since June.  Labs 07/01/12: TSH 1.424, free T4 1.33, free T3 3.8  Labs 02/23/12: TSH 1.999, free T4 1.26, free T3 3.7, IGF-1 pending, IGFBP-3 pending   Assessment and Plan:   ASSESSMENT:  1-2. Thyrotoxicosis with diffuse goiter, secondary to Graves disease/Hashimoto's thyroiditis.:   A.  The goiter was a bit more enlarged at his visit in January 2020, but we didn't know whether this mild enlargement was due to Graves' disease, Hashimoto's disease, or combination of the two diseases. When his TSI result came back, it was obvious that he still had active Graves' disease.        Fredirick Lathe was euthyroid in January at about the 49% of the normal thyroid hormone range on his current MTZ doses. Given the fact that he might be moving out of the area after graduation, it was reasonable to try to resume tapering his MTZ doses.   C. The pattern of all three TFTs increasing in parallel together that we saw from December 2015 to May 2016 and again from May to November 2017 was c/w recent flare ups of Hashimoto's thyroiditis.  His Hashimoto's T lymphocytes are still trying to destroy more thyrocytes. His thyroiditis was clinically quiescent in January 2020 and again today.  D. In August 2020, his free T4 and free T3 were higher and his TSH was low, all c/w a flare up of Graves disease and resulting hyperthyroidism, so I increased the MTZ dose by 50%. He was clinically nearly euthyroid in January 2021, but was still chemically mildly hyperthyroid.   E. He has since become more hyperthyroid and has needed an increase in his MTZ dose. He needs a further increase in the MTZ dose today. He has also had some  active thyroiditis recently.   F. The battle between his Graves' Dz B lymphocytes and his Hashimoto's Dz T lymphocytes continues. In all likelihood, the T cells will eventually win out. Unfortunately his Luiz Blare' disease was still dominant in August 2020, so he needs to continue his MTZ regimen. Although his TSI level in September 2018 was the lowest that it had been since February 2017, in January 2019 the TSI level more than doubled. In August 2019 the TSI was still elevated and his TSI level in January and in August 2020 was still elevated.  3. Prediabetes:   A. Since he had had three previous HbA1c values  in the prediabetic range, we officially diagnosed him as having prediabetes. However, he then had five HbA1c values that were normal. Unfortunately, as he gained more weight his HbA1c values increased toward the prediabetic range. His January 2020 HbA1c level was again high-normal for his age.     B. It appears that since last visit Coleby has continued to be serious about eating right and about exercising. He has gained another 2 pounds.   C. His HbA1c today is lower and well within normal limits, c/w his weight loss.   4. Morbid obesity: His weight has increased more.   5. Large breasts/Gynecomastia: The glandular tissue has decreased over time, but the areolae are larger today. .  6. Hypertension: His BP is good today He needs to do more cardio and lose more fat weight. We will consider starting hypertensive medication at his next visit if the BP is not improved.  PLAN:   1. Diagnostic: Will repeat TFTs, TSI, CMP, CBC  In 6 weeks. .   2. Therapeutic: Increase MTZ doses to 20 mg twice daily. Reduce caloric drinks. Eat Right. Exercise for an hour or more per day.  We will start BP medication as needed.  3. Patient education: Discussed his recent TFT results, increased weight, and overall thyroid hormone status. Continue to focus on reducing caloric drink intake, consuming less starch and sugar, and exercising for one hour per day or more.  4. Follow-up: 2 months   Level of Service: This visit lasted in excess of 65 minutes. More than 50% of the visit was devoted to counseling.  David Stall, MD, CDE Pediatric and Adult Endocrinology

## 2020-03-26 ENCOUNTER — Ambulatory Visit (INDEPENDENT_AMBULATORY_CARE_PROVIDER_SITE_OTHER): Payer: Medicaid Other | Admitting: "Endocrinology

## 2020-04-16 ENCOUNTER — Encounter (INDEPENDENT_AMBULATORY_CARE_PROVIDER_SITE_OTHER): Payer: Self-pay | Admitting: "Endocrinology

## 2020-04-16 ENCOUNTER — Ambulatory Visit (INDEPENDENT_AMBULATORY_CARE_PROVIDER_SITE_OTHER): Payer: Medicaid Other | Admitting: "Endocrinology

## 2020-04-16 ENCOUNTER — Other Ambulatory Visit: Payer: Self-pay

## 2020-04-16 VITALS — BP 120/78 | HR 68 | Ht 71.54 in | Wt 256.8 lb

## 2020-04-16 DIAGNOSIS — L538 Other specified erythematous conditions: Secondary | ICD-10-CM

## 2020-04-16 DIAGNOSIS — R7303 Prediabetes: Secondary | ICD-10-CM | POA: Diagnosis not present

## 2020-04-16 DIAGNOSIS — I10 Essential (primary) hypertension: Secondary | ICD-10-CM

## 2020-04-16 DIAGNOSIS — E0500 Thyrotoxicosis with diffuse goiter without thyrotoxic crisis or storm: Secondary | ICD-10-CM | POA: Diagnosis not present

## 2020-04-16 DIAGNOSIS — E063 Autoimmune thyroiditis: Secondary | ICD-10-CM

## 2020-04-16 DIAGNOSIS — R251 Tremor, unspecified: Secondary | ICD-10-CM

## 2020-04-16 LAB — COMPREHENSIVE METABOLIC PANEL
AG Ratio: 1.7 (calc) (ref 1.0–2.5)
ALT: 38 U/L (ref 9–46)
AST: 23 U/L (ref 10–40)
Albumin: 4.5 g/dL (ref 3.6–5.1)
Alkaline phosphatase (APISO): 77 U/L (ref 36–130)
BUN: 19 mg/dL (ref 7–25)
CO2: 26 mmol/L (ref 20–32)
Calcium: 9.6 mg/dL (ref 8.6–10.3)
Chloride: 102 mmol/L (ref 98–110)
Creat: 1.06 mg/dL (ref 0.60–1.35)
Globulin: 2.7 g/dL (calc) (ref 1.9–3.7)
Glucose, Bld: 75 mg/dL (ref 65–139)
Potassium: 4.5 mmol/L (ref 3.5–5.3)
Sodium: 139 mmol/L (ref 135–146)
Total Bilirubin: 0.6 mg/dL (ref 0.2–1.2)
Total Protein: 7.2 g/dL (ref 6.1–8.1)

## 2020-04-16 LAB — CBC WITH DIFFERENTIAL/PLATELET
Absolute Monocytes: 536 cells/uL (ref 200–950)
Basophils Absolute: 47 cells/uL (ref 0–200)
Basophils Relative: 0.7 %
Eosinophils Absolute: 228 cells/uL (ref 15–500)
Eosinophils Relative: 3.4 %
HCT: 48.5 % (ref 38.5–50.0)
Hemoglobin: 15.6 g/dL (ref 13.2–17.1)
Lymphs Abs: 2238 cells/uL (ref 850–3900)
MCH: 26 pg — ABNORMAL LOW (ref 27.0–33.0)
MCHC: 32.2 g/dL (ref 32.0–36.0)
MCV: 81 fL (ref 80.0–100.0)
MPV: 10.7 fL (ref 7.5–12.5)
Monocytes Relative: 8 %
Neutro Abs: 3652 cells/uL (ref 1500–7800)
Neutrophils Relative %: 54.5 %
Platelets: 303 10*3/uL (ref 140–400)
RBC: 5.99 10*6/uL — ABNORMAL HIGH (ref 4.20–5.80)
RDW: 13.4 % (ref 11.0–15.0)
Total Lymphocyte: 33.4 %
WBC: 6.7 10*3/uL (ref 3.8–10.8)

## 2020-04-16 LAB — POCT GLUCOSE (DEVICE FOR HOME USE): POC Glucose: 105 mg/dl — AB (ref 70–99)

## 2020-04-16 LAB — T3, FREE: T3, Free: 4.1 pg/mL (ref 3.0–4.7)

## 2020-04-16 LAB — TSH: TSH: 0.03 mIU/L — ABNORMAL LOW (ref 0.40–4.50)

## 2020-04-16 LAB — T4, FREE: Free T4: 1.2 ng/dL (ref 0.8–1.4)

## 2020-04-16 LAB — POCT GLYCOSYLATED HEMOGLOBIN (HGB A1C): Hemoglobin A1C: 5.1 % (ref 4.0–5.6)

## 2020-04-16 NOTE — Patient Instructions (Signed)
Follow up visit in 2 months. Please repeat lab tests in 6 weeks.

## 2020-04-16 NOTE — Progress Notes (Signed)
Subjective:  Patient Name: David Campbell Date of Birth: [birth year]  MRN: [id number]  David Campbell  presents at his clinic visit today for follow-up evaluation and management of his Graves' disease, Hashimoto's disease, linear growth delay, obesity, hypertension, acanthosis nigricans, dyspepsia, prediabetes, and gynecomastia.  HISTORY OF PRESENT ILLNESS:   David Campbell is a 20 y.o. African-American young man.  David Campbell was unaccompanied.    1. David Campbell was first seen in our clinic on 01/21/04 for evaluation and management of hyperthyroidism. He was then 20 years old. David Campbell had presented to Dr. Donnie Coffin previously with a history of diarrhea and increased heart rate beginning in October 2005. The child's activity level had been very high and he had been having night sweats. Mother  noted a goiter in the preceding week. On 01/18/04 the TSH was 0.004. Free T4 was greater than 12. CMP and CBC were normal. David Campbell was started on methimazole (MTZ), 5 mg per day. His TSI was positive as were his TPO antibody and thyroglobulin antibody, indicating that he had both Graves' disease and Hashimoto's disease.     2. Since then, David Campbell TFTs have fluctuated back and forth between hyperthyroidism and hypothyroidism. His methimazole doses have varied between 2.5-25 mg/day. Although his Hashimoto's disease has generally been clinically quiescent, his Hashimoto's T cells have gradually destroyed some of his thyrocytes over time and we have been able to gradually taper the methimazole dose over time as well. Unfortunately, he has also had several recurrences of increased TSI levels and worsening hyperthyroidism as well.   3. David Campbell's last [facility] visit was on 12/28/19. At that visit I increased his MTZ doses to 20 mg in the mornings and 20 mg in the evenings.   A. In the interim he has been healthy.   B. He no longer has pains in the right TMJ area. He has not been having intermittent pains in his thyroid gland.    C. He says that he has been eating somewhat healthier and eating less. He is doing a lot of his own cooking. He walks a lot.   D. He remains on the new doses of methimazole as noted above.    4. Pertinent Review of Systems:  Constitutional: David Campbell feels "good". His allergies have not been bothering him much so far this Spring. His energy level is "good". He has enough energy to do all the things he wants to do. He no longer gets tired very often. He no longer feels either hyper or sluggish. He is sleeping well.  Eyes: Vision seems to be great. There are no recognized eye problems. Neck: As above. There are no recognized symptoms of the anterior neck.  Heart: There are no recognized heart problems. The ability to perform his usual ADLs and do other physical activities seems normal.  Gastrointestinal: He says that he has variable belly hunger. Bowel movents seem normal. There are no other recognized GI problems. Hands: No tremor or other problems Legs: Muscle mass and strength seem normal. He has not had any problems. No edema is noted.  Feet: There are no obvious foot problems. No edema is noted.  Neurologic: There are no recognized problems with muscle movement and strength, sensation, or coordination. Breasts: Maybe a little smaller  Skin: No pruritus  PAST MEDICAL, FAMILY, AND SOCIAL HISTORY  Past Medical History:  Diagnosis Date  . ADHD (attention deficit hyperactivity disorder)   . Asthma, chronic   . Delayed linear growth   . Environmental allergies   . Graves disease   .  Hashimoto's disease   . Tachycardia   . Thyrotoxicosis with diffuse goiter     Family History  Problem Relation Age of Onset  . Thyroid disease Mother        Mother had Graves' disease at age 47. She was treated with PTU for 4 years, then went into remission without medication.  [name] Heart disease Maternal Grandmother   . Thyroid disease Maternal Grandfather        Maternal grandfather had Graves' disease for  which he was treated with partial thyroidectomy. When recurrence of Graves' disease occurred, he received high-131 therapy  . Diabetes Paternal Grandmother      Current Outpatient Medications:  .  cetirizine (ZYRTEC) 10 MG tablet, Take 10 mg by mouth daily., Disp: , Rfl:  .  methimazole (TAPAZOLE) 10 MG tablet, Take 2 tablets (20 mg total) by mouth 2 (two) times daily., Disp: 120 tablet, Rfl: 11 .  fluticasone (FLONASE) 50 MCG/ACT nasal spray, Place 2 sprays into the nose daily. Reported on 03/05/2015 (Patient not taking: No sig reported), Disp: , Rfl:  .  methocarbamol (ROBAXIN) 500 MG tablet, Take 1 tablet (500 mg total) by mouth 2 (two) times daily. (Patient not taking: No sig reported), Disp: 20 tablet, Rfl: 0 .  PROVENTIL HFA 108 (90 Base) MCG/ACT inhaler, INHALE 2 PUFFS Q 4-6 H PRN (Patient not taking: No sig reported), Disp: , Rfl: 0  Allergies as of 04/16/2020 - Review Complete 04/16/2020  Allergen Reaction Noted  . Food  10/19/2012     reports that he has never smoked. He has never used smokeless tobacco. He reports that he does not drink alcohol and does not use drugs.  1. School and family: He graduated from high school on June 5th 2020. 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 .   2. Activities: He is doing a lot of walking. He still does some push ups, but no weight-lifting.  3. Primary Care Provider: Maryellen Pile, MD  REVIEW OF SYSTEMS: There are no other significant problems involving David Campbell's other body systems.   Objective:  Vital Signs:  BP 120/78 (BP Location: Right Arm, Patient Position: Sitting, Cuff Size: Normal) Comment (Cuff Size): long  Pulse 68   Ht 5' 11.54" (1.817 m)   Wt 256 lb 12.8 oz (116.5 kg)   BMI 35.28 kg/m     Ht Readings from Last 3 Encounters:  04/16/20 5' 11.54" (1.817 m)  05/16/19 6' (1.829 m) (81 %, Z= 0.86)*  01/23/19 5' 11.65" (1.82 m) (77 %, Z= 0.75)*   * Growth percentiles are based on CDC (Boys, 2-20 Years)  data.   Wt Readings from Last 3 Encounters:  04/16/20 256 lb 12.8 oz (116.5 kg)  12/28/19 255 lb 12.8 oz (116 kg)  05/16/19 253 lb 8.5 oz (115 kg) (>99 %, Z= 2.47)*   * Growth percentiles are based on CDC (Boys, 2-20 Years) data.   HC Readings from Last 3 Encounters:  No data found for [facility]   Body surface area is 2.42 meters squared.  Facility age limit for growth percentiles is 20 years. Facility age limit for growth percentiles is 20 years. Facility age limit for growth percentiles is 20 years.  PHYSICAL EXAM:  Constitutional: David Campbell appears healthy, but still obese. He has gained 1 additional pound since his last visit. He is alert and bright. His affect and insight are normal. He is very Teacher, English as a foreign language. I always enjoy seeing him.  Eyes: There is no  arcus or proptosis.  Mouth: The oropharynx appears normal. The tongue appears normal. There is normal oral moisture. There is no obvious gingivitis. He has no tongue tremor.  Neck: There are no bruits present. His anterior neck appears enlarged. The thyroid gland is less enlarged, but still diffusely enlarged, at about 21+ grams in size.  The consistency of the thyroid gland is relatively full. There is no thyroid tenderness to palpation. Lungs: The lungs are clear. Air movement is good. Heart: The heart rhythm and rate appear normal. Heart sounds S1 and S2 are normal. I do not appreciate any pathologic heart murmurs. Abdomen: The abdomen is enlarged. Bowel sounds are normal. The abdomen is soft and non-tender. There is no obviously palpable hepatomegaly, splenomegaly, or other masses.  Arms: Muscle mass appears appropriate for age.  Hands: There is a 1+ tremor. Phalangeal and metacarpophalangeal joints appear normal. Palms show 3+ palmar erythema.  Legs: Muscle mass appears appropriate for age. There is no edema.  Neurologic: Muscle strength is normal for age and gender  in both the upper and the lower extremities. Muscle tone appears normal.  Sensation to touch is normal in the legs. Breast tissue: At his visit in December 2021, the breasts were fatty, but were now only Tanner stage I; Areolae measured 45 mm on the right and 4 mm on the left, compared with 43 mm on the left and 40 mm on the right at his last visit. [name]  LAB DATA: Results for orders placed or performed in visit on 04/16/20 (from the past 504 hour(s))  POCT Glucose (Device for Home Use)   Collection Time: 04/16/20  3:38 PM  Result Value Ref Range   Glucose Fasting, POC     POC Glucose 105 (A) 70 - 99 mg/dl  POCT glycosylated hemoglobin (Hb A1C)   Collection Time: 04/16/20  3:43 PM  Result Value Ref Range   Hemoglobin A1C 5.1 4.0 - 5.6 %   HbA1c POC (<> result, manual entry)     HbA1c, POC (prediabetic range)     HbA1c, POC (controlled diabetic range)    Results for orders placed or performed in visit on 12/28/19 (from the past 504 hour(s))  T3, free   Collection Time: 04/15/20  2:59 PM  Result Value Ref Range   T3, Free 4.1 3.0 - 4.7 pg/mL  T4, free   Collection Time: 04/15/20  2:59 PM  Result Value Ref Range   Free T4 1.2 0.8 - 1.4 ng/dL  TSH   Collection Time: 04/15/20  2:59 PM  Result Value Ref Range   TSH 0.03 (L) 0.40 - 4.50 mIU/L  Comprehensive metabolic panel   Collection Time: 04/15/20  2:59 PM  Result Value Ref Range   Glucose, Bld 75 65 - 139 mg/dL   BUN 19 7 - 25 mg/dL   Creat 6.50 3.54 - 6.56 mg/dL   BUN/Creatinine Ratio NOT APPLICABLE 6 - 22 (calc)   Sodium 139 135 - 146 mmol/L   Potassium 4.5 3.5 - 5.3 mmol/L   Chloride 102 98 - 110 mmol/L   CO2 26 20 - 32 mmol/L   Calcium 9.6 8.6 - 10.3 mg/dL   Total Protein 7.2 6.1 - 8.1 g/dL   Albumin 4.5 3.6 - 5.1 g/dL   Globulin 2.7 1.9 - 3.7 g/dL (calc)   AG Ratio 1.7 1.0 - 2.5 (calc)   Total Bilirubin 0.6 0.2 - 1.2 mg/dL   Alkaline phosphatase (APISO) 77 36 - 130 U/L   AST 23  10 - 40 U/L   ALT 38 9 - 46 U/L  CBC with Differential/Platelet   Collection Time: 04/15/20  2:59 PM  Result  Value Ref Range   WBC 6.7 3.8 - 10.8 Thousand/uL   RBC 5.99 (H) 4.20 - 5.80 Million/uL   Hemoglobin 15.6 13.2 - 17.1 g/dL   HCT 16.1 09.6 - 04.5 %   MCV 81.0 80.0 - 100.0 fL   MCH 26.0 (L) 27.0 - 33.0 pg   MCHC 32.2 32.0 - 36.0 g/dL   RDW 40.9 81.1 - 91.4 %   Platelets 303 140 - 400 Thousand/uL   MPV 10.7 7.5 - 12.5 fL   Neutro Abs 3,652 1,500 - 7,800 cells/uL   Lymphs Abs 2,238 850 - 3,900 cells/uL   Absolute Monocytes 536 200 - 950 cells/uL   Eosinophils Absolute 228 15 - 500 cells/uL   Basophils Absolute 47 0 - 200 cells/uL   Neutrophils Relative % 54.5 %   Total Lymphocyte 33.4 %   Monocytes Relative 8.0 %   Eosinophils Relative 3.4 %   Basophils Relative 0.7 %    Labs 04/16/20: HbA1c 5.1%, CBG 105  Labs 04/15/20: TSH 0.03, free T4 1.2, free T3 4.1; CMP normal; CBC normal, except RBC elevated at 5.99 (ref 4.20-5.80) and MCH low at 26.0 (ref 27-33);   Labs 12/28/19: HbA1c 5.2%, CBG 102  Labs 12/26/19: TSH 0.01, free T4 1.5, free T3 4.7, TSI pending  Labs 05/16/19: TSH <0.010  Labs 01/23/19: HbA1c 5.3%, CBG 109; TSH 0.34, free T4 1.4, free T3 4.4, TSI 213; CMP normal; CBC normal, except his RBC count was slightly elevated at 5.81 (ref 4.20-5.80)  Labs 09/05/08: TSH 0.28, free T4 1.5, free T3 4.2, TSI 178  Labs 02/02/18: HbA1c 5.5%, CBG 103  Labs 01/31/18: TSH 1.59, free T4 1.3, free T3 3.5, TSI 191  Labs 08/24/17: HbA1c 5.5%, CBG 81  Labs 08/19/17: TSH 1.66, free T4 1.4, free T3 3.8, TSI 189  Labs 04/12/17: HbA1c 5.5%, CBG 115  Labs 04/08/17: TSH 1.94, free T4 1.5, free T3 4.1, TSI 454  Labs 01/20/17: TSH 2.51, free T4 1.4, free T3 3.6, TSI 480  Labs 10/20/16: HbA1c 5.4%, CBG 113  Labs 09/19/16: TSH 2.58, free T4 1.4, free T3 3.8, TSI 213  Labs 06/22/16: HbA1c 5.3%, CBG 91  Labs 06/17/16: TSH 1.11, free T4 1.4, free T3 4.1, TSI 286 (ref <140, actually <100)  Labs 03/13/16: HbA1c 5.7%; TSH 1.41, free T4 1.2, free T3 3.5, TSI 349  Labs 12/16/15: HbA1c 5.7%  Labs  12/11/15: TSH 3.37, free T4 1.4, free T3 4.1  Labs 07/24/15: HbA1c 5.5%  Labs 05/29/15: TSH 0.23, free T4 1.2, free T3 3.7; TSI 477; CMP normal; CBC normal except for MCV 77.2, iron 106  Labs 04/11/15: HbA1c 5.4%; WBC 6.3, RBC 5.63 (normal 3.80-5.20), hemoglobin 14.3, hematocrit 42.4 %, MCV 75.3 (normal 77-95), neutrophils 3.0; CMP normal with AST 21 and ALT 27; TSH <0.01, free T4 1.4, free T3 4.8, TSI 481  Labs 03/05/15: CBG 125, HbA1c 5.2%  Labs 02/28/15: TSH <0.01, free T4 2.1, free T3 9.3, TSI 434; testosterone 217, estradiol 32  Labs 08/30/14: HbA1c 5.3%  Labs 08/24/14: TSH 1.756, free T4 1.40, free T3 4.1, TSI 135; testosterone 187, estradiol 27.8  Labs 05/15/14: TSH 1.247, free T4 1.18, free T3 4.3, TSI 120 (normal by lab is < 140); testosterone 234, estradiol 19.8; HbA1c 5.7%  Labs 12/21/13: HbA1c 5.4%, compared to 5.2% at last visit.  Labs 12/14/13:  TSH 1.063, free T4 1.02, free T3 4.1,TSI 123; testosterone 176, estradiol 13.9  Labs 07/04/13: TSH 1.056, free T4 1.22, free T3 4.5, TSI 60; testosterone 95, estradiol < 11.8  Labs 01/27/12: TSH 2.769, free T4 1.28, free T3 3.7; testosterone 54, estradiol < 11.8  Labs 09/08/12: [facility] can't find the lab results. David Campbell has no record of any labs done since June.  Labs 07/01/12: TSH 1.424, free T4 1.33, free T3 3.8  Labs 02/23/12: TSH 1.999, free T4 1.26, free T3 3.7, IGF-1 pending, IGFBP-3 pending   Assessment and Plan:   ASSESSMENT:  1-2. Thyrotoxicosis with diffuse goiter, secondary to Graves disease/Hashimoto's thyroiditis.:   A. David Campbell's Graves' disease and his Hashimoto's disease have been battling each other since 2006. At times his TSI and Graves' disease activity have been higher, causing higher TFTS and more signs and symptoms of Graves' disease and requiring higher doses of methimazole (MTZ) for treatment. At other times, however, he has had more signs and symptoms of Hashimoto's disease, his TFTs have been lower, and we have  reduced his MTZ doses.   B. He was clinically nearly euthyroid in January 2021, but was still chemically mildly hyperthyroid. Unfortunately, he has since become more hyperthyroid and has needed increases in his MTZ dose. He needed a further increase in the MTZ dose December 2021 to 20 mg, twice daily. He had also had some active thyroiditis recently prior to that December visit.   C. At today's visit in April 2022, he feels good. His goiter is a bit smaller. His heart rate is normal. He does have a hand tremor and palmar erythema. His free T4 and free T3 are lower and within normal limits. His TSH is still suppressed, but higher. It may take another 2-3 months for the TSH to re-set.   D. The battle between his Graves' Dz B lymphocytes and his Hashimoto's Dz T lymphocytes continues. In all likelihood, the T cells will eventually win out. .  3. Prediabetes:   A. Since he had had three previous HbA1c values in the prediabetic range, we officially diagnosed him as having prediabetes. However, he then had five HbA1c values that were normal. Unfortunately, as he gained more weight his HbA1c values increased toward the prediabetic range. His January 2020 HbA1c level was again high-normal for his age.     B. It appears that since last visit David Campbell has continued to be fairly serious about eating right and about exercising. Although he has gained another 1 pound, his HbA1c today is lower and well within normal limits.   4. Morbid obesity: His weight has increased slightly more.   5. Large breasts/Gynecomastia: The glandular tissue has decreased over time, but the areolae were larger in December 2021.  6. Hypertension: His SBP is good today, but the DBP is still elevated. He needs to do more cardio and lose more fat weight. We will consider starting hypertensive medication at his next visit if the BP is not improved. 7-8. Tremor/palmar erythema: He has a tremor and palmar erythema today, c/w still being hyperthyroid.    PLAN:   1. Diagnostic: Will repeat TFTs, TSI, CMP, CBC in 6 weeks. .   2. Therapeutic: Continue MTZ doses of 20 mg twice daily. Reduce caloric drinks. Eat Right. Exercise for an hour or more per day.  We will start BP medication as needed.  3. Patient education: Discussed his recent TFT results, increased weight, and overall thyroid hormone status. Continue to focus on reducing caloric drink  intake, consuming less starch and sugar, and exercising for one hour per day or more.  4. Follow-up: 2 months   Level of Service: This visit lasted in excess of 60 minutes. More than 50% of the visit was devoted to counseling.  David StallMichael J. Zurisadai Helminiak, MD, CDE Pediatric and Adult Endocrinology

## 2020-06-26 ENCOUNTER — Ambulatory Visit (INDEPENDENT_AMBULATORY_CARE_PROVIDER_SITE_OTHER): Payer: Medicaid Other | Admitting: "Endocrinology

## 2020-06-26 NOTE — Progress Notes (Deleted)
Subjective:  Patient Name: David Campbell Date of Birth: 05-06-2000  MRN: [id number]  David Campbell  presents at his clinic visit today for follow-up evaluation and management of his Graves' disease, Hashimoto's disease, linear growth delay, obesity, hypertension, acanthosis nigricans, dyspepsia, prediabetes, and gynecomastia.  HISTORY OF PRESENT ILLNESS:   David Campbell is a 20 y.o. African-American young man.  David Campbell was unaccompanied.    1. David Campbell was first seen in our clinic on 01/21/04 for evaluation and management of hyperthyroidism. He was then 20 years old. David Campbell had presented to Dr. Donnie Coffin previously with a history of diarrhea and increased heart rate beginning in October 2005. The child's activity level had been very high and he had been having night sweats. Mother  noted a goiter in the preceding week. On 01/18/04 the TSH was 0.004. Free T4 was greater than 12. CMP and CBC were normal. [name] was started on methimazole (MTZ), 5 mg per day. His TSI was positive as were his TPO antibody and thyroglobulin antibody, indicating that he had both Graves' disease and Hashimoto's disease.     2. Since then, [name]'s TFTs have fluctuated back and forth between hyperthyroidism and hypothyroidism. His methimazole doses have varied between 2.5-25 mg/day. Although his Hashimoto's disease has generally been clinically quiescent, his Hashimoto's T cells have gradually destroyed some of his thyrocytes over time and we have been able to gradually taper the methimazole dose over time as well. Unfortunately, he has also had several recurrences of increased TSI levels and worsening hyperthyroidism as well.   3. David Campbell's last [facility] visit was on 04/16/20. At that visit I continued his MTZ doses of 20 mg in the mornings and 20 mg in the evenings. I also asked him to Eat Right and to exercise.  A. In the interim he has been healthy.   B. He no longer has pains in the right TMJ area. He has not been  having intermittent pains in his thyroid gland.   C. He says that he has been eating somewhat healthier and eating less. He is doing a lot of his own cooking. He walks a lot.   D. He remains on the new doses of methimazole as noted above.    4. Pertinent Review of Systems:  Constitutional: David Campbell feels "good". His allergies have not been bothering him much so far this Spring. His energy level is "good". He has enough energy to do all the things he wants to do. He no longer gets tired very often. He no longer feels either hyper or sluggish. He is sleeping well.  Eyes: Vision seems to be great. There are no recognized eye problems. Neck: As above. There are no recognized symptoms of the anterior neck.  Heart: There are no recognized heart problems. The ability to perform his usual ADLs and do other physical activities seems normal.  Gastrointestinal: He says that he has variable belly hunger. Bowel movents seem normal. There are no other recognized GI problems. Hands: No tremor or other problems Legs: Muscle mass and strength seem normal. He has not had any problems. No edema is noted.  Feet: There are no obvious foot problems. No edema is noted.  Neurologic: There are no recognized problems with muscle movement and strength, sensation, or coordination. Breasts: Maybe a little smaller  Skin: No pruritus  PAST MEDICAL, FAMILY, AND SOCIAL HISTORY  Past Medical History:  Diagnosis Date   ADHD (attention deficit hyperactivity disorder)    Asthma, chronic    Delayed linear growth  Environmental allergies    Graves disease    Hashimoto's disease    Tachycardia    Thyrotoxicosis with diffuse goiter     Family History  Problem Relation Age of Onset   Thyroid disease Mother        Mother had Graves' disease at age 68. She was treated with PTU for 4 years, then went into remission without medication.   Heart disease Maternal Grandmother    Thyroid disease Maternal Grandfather         Maternal grandfather had Graves' disease for which he was treated with partial thyroidectomy. When recurrence of Graves' disease occurred, he received high-131 therapy   Diabetes Paternal Grandmother      Current Outpatient Medications:    cetirizine (ZYRTEC) 10 MG tablet, Take 10 mg by mouth daily., Disp: , Rfl:    fluticasone (FLONASE) 50 MCG/ACT nasal spray, Place 2 sprays into the nose daily. Reported on 03/05/2015 (Patient not taking: No sig reported), Disp: , Rfl:    methimazole (TAPAZOLE) 10 MG tablet, Take 2 tablets (20 mg total) by mouth 2 (two) times daily., Disp: 120 tablet, Rfl: 11   methocarbamol (ROBAXIN) 500 MG tablet, Take 1 tablet (500 mg total) by mouth 2 (two) times daily. (Patient not taking: No sig reported), Disp: 20 tablet, Rfl: 0   PROVENTIL HFA 108 (90 Base) MCG/ACT inhaler, INHALE 2 PUFFS Q 4-6 H PRN (Patient not taking: No sig reported), Disp: , Rfl: 0  Allergies as of 06/26/2020 - Review Complete 04/16/2020  Allergen Reaction Noted   Food  10/19/2012     reports that he has never smoked. He has never used smokeless tobacco. He reports that he does not drink alcohol and does not use drugs.  1. School and family: He graduated from high school on June 5th 2020. 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 .   2. Activities: He is doing a lot of walking. He still does some push ups, but no weight-lifting.  3. Primary Care Provider: Maryellen Pile, MD  REVIEW OF SYSTEMS: There are no other significant problems involving David Campbell's other body systems.   Objective:  Vital Signs:  There were no vitals taken for this visit.    Ht Readings from Last 3 Encounters:  04/16/20 5' 11.54" (1.817 m)  05/16/19 6' (1.829 m) (81 %, Z= 0.86)*  01/23/19 5' 11.65" (1.82 m) (77 %, Z= 0.75)*   * Growth percentiles are based on CDC (Boys, 2-20 Years) data.   Wt Readings from Last 3 Encounters:  04/16/20 256 lb 12.8 oz (116.5 kg)  12/28/19 255 lb 12.8 oz (116  kg)  05/16/19 253 lb 8.5 oz (115 kg) (>99 %, Z= 2.47)*   * Growth percentiles are based on CDC (Boys, 2-20 Years) data.   HC Readings from Last 3 Encounters:  No data found for [facility]   There is no height or weight on file to calculate BSA.  Facility age limit for growth percentiles is 20 years. Facility age limit for growth percentiles is 20 years. Facility age limit for growth percentiles is 20 years.  PHYSICAL EXAM:  Constitutional: Chevon appears healthy, but still obese. He has gained 1 additional pound since his last visit. He is alert and bright. His affect and insight are normal. He is very Teacher, English as a foreign language. I always enjoy seeing him.  Eyes: There is no arcus or proptosis.  Mouth: The oropharynx appears normal. The tongue appears normal. There is normal oral moisture. There  is no obvious gingivitis. He has no tongue tremor.  Neck: There are no bruits present. His anterior neck appears enlarged. The thyroid gland is less enlarged, but still diffusely enlarged, at about 21+ grams in size.  The consistency of the thyroid gland is relatively full. There is no thyroid tenderness to palpation. Lungs: The lungs are clear. Air movement is good. Heart: The heart rhythm and rate appear normal. Heart sounds S1 and S2 are normal. I do not appreciate any pathologic heart murmurs. Abdomen: The abdomen is enlarged. Bowel sounds are normal. The abdomen is soft and non-tender. There is no obviously palpable hepatomegaly, splenomegaly, or other masses.  Arms: Muscle mass appears appropriate for age.  Hands: There is a 1+ tremor. Phalangeal and metacarpophalangeal joints appear normal. Palms show 3+ palmar erythema.  Legs: Muscle mass appears appropriate for age. There is no edema.  Neurologic: Muscle strength is normal for age and gender  in both the upper and the lower extremities. Muscle tone appears normal. Sensation to touch is normal in the legs. Breast tissue: At his visit in December 2021, the breasts  were fatty, but were now only Tanner stage I; Areolae measured 45 mm on the right and 4 mm on the left, compared with 43 mm on the left and 40 mm on the right at his last visit. [name].  LAB DATA: No results found for this or any previous visit (from the past 504 hour(s)).   Labs 04/16/20: HbA1c 5.1%, CBG 105  Labs 04/15/20: TSH 0.03, free T4 1.2, free T3 4.1; CMP normal; CBC normal, except RBC elevated at 5.99 (ref 4.20-5.80) and MCH low at 26.0 (ref 27-33);   Labs 12/28/19: HbA1c 5.2%, CBG 102  Labs 12/26/19: TSH 0.01, free T4 1.5, free T3 4.7, TSI pending  Labs 05/16/19: TSH <0.010  Labs 01/23/19: HbA1c 5.3%, CBG 109; TSH 0.34, free T4 1.4, free T3 4.4, TSI 213; CMP normal; CBC normal, except his RBC count was slightly elevated at 5.81 (ref 4.20-5.80)  Labs 09/05/08: TSH 0.28, free T4 1.5, free T3 4.2, TSI 178  Labs 02/02/18: HbA1c 5.5%, CBG 103  Labs 01/31/18: TSH 1.59, free T4 1.3, free T3 3.5, TSI 191  Labs 08/24/17: HbA1c 5.5%, CBG 81  Labs 08/19/17: TSH 1.66, free T4 1.4, free T3 3.8, TSI 189  Labs 04/12/17: HbA1c 5.5%, CBG 115  Labs 04/08/17: TSH 1.94, free T4 1.5, free T3 4.1, TSI 454  Labs 01/20/17: TSH 2.51, free T4 1.4, free T3 3.6, TSI 480  Labs 10/20/16: HbA1c 5.4%, CBG 113  Labs 09/19/16: TSH 2.58, free T4 1.4, free T3 3.8, TSI 213  Labs 06/22/16: HbA1c 5.3%, CBG 91  Labs 06/17/16: TSH 1.11, free T4 1.4, free T3 4.1, TSI 286 (ref <140, actually <100)  Labs 03/13/16: HbA1c 5.7%; TSH 1.41, free T4 1.2, free T3 3.5, TSI 349  Labs 12/16/15: HbA1c 5.7%  Labs 12/11/15: TSH 3.37, free T4 1.4, free T3 4.1  Labs 07/24/15: HbA1c 5.5%  Labs 05/29/15: TSH 0.23, free T4 1.2, free T3 3.7; TSI 477; CMP normal; CBC normal except for MCV 77.2, iron 106  Labs 04/11/15: HbA1c 5.4%; WBC 6.3, RBC 5.63 (normal 3.80-5.20), hemoglobin 14.3, hematocrit 42.4 %, MCV 75.3 (normal 77-95), neutrophils 3.0; CMP normal with AST 21 and ALT 27; TSH <0.01, free T4 1.4, free T3 4.8, TSI 481  Labs 03/05/15:  CBG 125, HbA1c 5.2%  Labs 02/28/15: TSH <0.01, free T4 2.1, free T3 9.3, TSI 434; testosterone 217, estradiol 32  Labs 08/30/14:  HbA1c 5.3%  Labs 08/24/14: TSH 1.756, free T4 1.40, free T3 4.1, TSI 135; testosterone 187, estradiol 27.8  Labs 05/15/14: TSH 1.247, free T4 1.18, free T3 4.3, TSI 120 (normal by lab is < 140); testosterone 234, estradiol 19.8; HbA1c 5.7%  Labs 12/21/13: HbA1c 5.4%, compared to 5.2% at last visit.  Labs 12/14/13:  TSH 1.063, free T4 1.02, free T3 4.1,TSI 123; testosterone 176, estradiol 13.9  Labs 07/04/13: TSH 1.056, free T4 1.22, free T3 4.5, TSI 60; testosterone 95, estradiol < 11.8  Labs 01/27/12: TSH 2.769, free T4 1.28, free T3 3.7; testosterone 54, estradiol < 11.8  Labs 09/08/12: [facility] can't find the lab results. Loney Loh has no record of any labs done since June.  Labs 07/01/12: TSH 1.424, free T4 1.33, free T3 3.8  Labs 02/23/12: TSH 1.999, free T4 1.26, free T3 3.7, IGF-1 pending, IGFBP-3 pending   Assessment and Plan:   ASSESSMENT:  1-2. Thyrotoxicosis with diffuse goiter, secondary to Graves disease/Hashimoto's thyroiditis.:   A. Zalan's Graves' disease and his Hashimoto's disease have been battling each other since 2006. At times his TSI and Graves' disease activity have been higher, causing higher TFTS and more signs and symptoms of Graves' disease and requiring higher doses of methimazole (MTZ) for treatment. At other times, however, he has had more signs and symptoms of Hashimoto's disease, his TFTs have been lower, and we have reduced his MTZ doses.   B. He was clinically nearly euthyroid in January 2021, but was still chemically mildly hyperthyroid. Unfortunately, he has since become more hyperthyroid and has needed increases in his MTZ dose. He needed a further increase in the MTZ dose December 2021 to 20 mg, twice daily. He had also had some active thyroiditis recently prior to that December visit.   C. At today's visit in April 2022, he feels  good. His goiter is a bit smaller. His heart rate is normal. He does have a hand tremor and palmar erythema. His free T4 and free T3 are lower and within normal limits. His TSH is still suppressed, but higher. It may take another 2-3 months for the TSH to re-set.   D. The battle between his Graves' Dz B lymphocytes and his Hashimoto's Dz T lymphocytes continues. In all likelihood, the T cells will eventually win out. .  3. Prediabetes:   A. Since he had had three previous HbA1c values in the prediabetic range, we officially diagnosed him as having prediabetes. However, he then had five HbA1c values that were normal. Unfortunately, as he gained more weight his HbA1c values increased toward the prediabetic range. His January 2020 HbA1c level was again high-normal for his age.     B. It appears that since last visit Sixto has continued to be fairly serious about eating right and about exercising. Although he has gained another 1 pound, his HbA1c today is lower and well within normal limits.   4. Morbid obesity: His weight has increased slightly more.   5. Large breasts/Gynecomastia: The glandular tissue has decreased over time, but the areolae were larger in December 2021.  6. Hypertension: His SBP is good today, but the DBP is still elevated. He needs to do more cardio and lose more fat weight. We will consider starting hypertensive medication at his next visit if the BP is not improved. 7-8. Tremor/palmar erythema: He has a tremor and palmar erythema today, c/w still being hyperthyroid.   PLAN:   1. Diagnostic: Will repeat TFTs, TSI, CMP, CBC in 6  weeks. .   2. Therapeutic: Continue MTZ doses of 20 mg twice daily. Reduce caloric drinks. Eat Right. Exercise for an hour or more per day.  We will start BP medication as needed.  3. Patient education: Discussed his recent TFT results, increased weight, and overall thyroid hormone status. Continue to focus on reducing caloric drink intake, consuming less  starch and sugar, and exercising for one hour per day or more.  4. Follow-up: 2 months   Level of Service: This visit lasted in excess of 60 minutes. More than 50% of the visit was devoted to counseling.  David Stall, MD, CDE Pediatric and Adult Endocrinology

## 2020-12-28 ENCOUNTER — Other Ambulatory Visit (HOSPITAL_COMMUNITY): Payer: Self-pay

## 2021-02-21 ENCOUNTER — Telehealth (INDEPENDENT_AMBULATORY_CARE_PROVIDER_SITE_OTHER): Payer: Self-pay | Admitting: "Endocrinology

## 2021-02-21 DIAGNOSIS — E0500 Thyrotoxicosis with diffuse goiter without thyrotoxic crisis or storm: Secondary | ICD-10-CM

## 2021-02-21 MED ORDER — METHIMAZOLE 10 MG PO TABS: 20.0000 mg | ORAL_TABLET | Freq: Two times a day (BID) | ORAL | 0 refills | Status: DC

## 2021-02-21 NOTE — Telephone Encounter (Signed)
1 month script sent to pharmacy 

## 2021-02-21 NOTE — Telephone Encounter (Signed)
°  Who's calling (name and relationship to patient) :Self/ David Campbell contact [phone]  Provider they see:Dr. Fransico Michael   Reason for call:caller needed refills for the methimazole and the pharmacy told him that he needed to call the office to have the refills sent.      PRESCRIPTION REFILL ONLY  Name of prescription:methimazole   Pharmacy:[facility] and [address]

## 2021-03-11 NOTE — Progress Notes (Signed)
Subjective:  Patient Name: David Campbell Date of Birth: April 17, 2000  MRN: [id number]  David Campbell  presents at his clinic visit today for follow-up evaluation and management of his Graves' disease, Hashimoto's disease, obesity, hypertension, acanthosis nigricans, dyspepsia, prediabetes, and gynecomastia.  HISTORY OF PRESENT ILLNESS:   David Campbell is a 21 y.o. African-American young man.  David Campbell was accompanied by his significant other, Miss Gwyneth Sprout. ([id number])    1. David Campbell was first seen in our clinic on 01/21/04 for evaluation and management of hyperthyroidism. He was then 21 years old. David Campbell had presented to Dr. Truddie Coco previously with a history of diarrhea and increased heart rate beginning in October 2005. The child's activity level had been very high and he had been having night sweats. Mother  noted a goiter in the preceding week. On 01/18/04 the TSH was 0.004. Free T4 was greater than 12. CMP and CBC were normal. Annette was started on methimazole (MTZ), 5 mg per day. His TSI was positive as were his TPO antibody and thyroglobulin antibody, indicating that he had both Graves' disease and Hashimoto's disease.     2. Since then, David Campbell's TFTs have fluctuated back and forth between hyperthyroidism and hypothyroidism. His methimazole doses have varied between 2.5-25 mg/day. Although his Hashimoto's disease has generally been clinically quiescent, his 21 T cells have gradually destroyed some of his thyrocytes over time and we have been able to gradually taper the methimazole dose over time as well. Unfortunately, he has also had several recurrences of increased TSI levels and worsening hyperthyroidism as well.   3. [facility]'s last [facility] visit was on 04/16/20. At that visit I continued his MTZ doses of 20 mg in the mornings and 20 mg in the evenings.   A. In the interim he has been healthy. He ran out of the MTZ about one month ago. He has had some heart palpitations. He has not  been jittery, but may be a bit hyper. He has been warmer and sweating more. He does not have any difficulties with sleeping. He has been working out and losing a lot of weight.  B. He no longer has pains in the right TMJ area. He has not been having intermittent pains in his thyroid gland.   C. He says that he has been eating somewhat healthier and eating less. He is doing a lot of his own cooking. He works out more.     4. Pertinent Review of Systems:  Constitutional: David Campbell feels "good". His allergies have not been bothering him much so far this year. His energy level is "good". He has enough energy to do all the things he wants to do. He no longer gets tired very often.  Eyes: Vision seems to be great. There are no recognized eye problems. He can look up and out without ay pressure symptoms.  Neck: As above. There are no recognized symptoms of the anterior neck.  Heart: As above. There are no recognized heart problems. The ability to perform his usual ADLs and do other physical activities seems normal.  Gastrointestinal: He says he has less belly hunger. Bowel movents seem normal. There are no other recognized GI problems. Hands: No tremor or other problems Legs: Muscle mass and strength seem normal. He has not had any problems. No edema is noted.  Feet: There are no obvious foot problems. No edema is noted.  Neurologic: There are no recognized problems with muscle movement and strength, sensation, or coordination. Breasts: Maybe a little smaller  Skin: No  pruritus  PAST MEDICAL, FAMILY, AND SOCIAL HISTORY  Past Medical History:  Diagnosis Date   ADHD (attention deficit hyperactivity disorder)    Asthma, chronic    Delayed linear growth    Environmental allergies    Graves disease    Hashimoto's disease    Tachycardia    Thyrotoxicosis with diffuse goiter     Family History  Problem Relation Age of Onset   Thyroid disease Mother        Mother had Graves' disease at age 33. She was  treated with PTU for 4 years, then went into remission without medication.   Heart disease Maternal Grandmother    Thyroid disease Maternal Grandfather        Maternal grandfather had Graves' disease for which he was treated with partial thyroidectomy. When recurrence of Graves' disease occurred, he received high-131 therapy   Diabetes Paternal Grandmother      Current Outpatient Medications:    cetirizine (ZYRTEC) 10 MG tablet, Take 10 mg by mouth daily., Disp: , Rfl:    fluticasone (FLONASE) 50 MCG/ACT nasal spray, Place 2 sprays into the nose daily. Reported on 03/05/2015 (Patient not taking: Reported on 12/28/2019), Disp: , Rfl:    methimazole (TAPAZOLE) 10 MG tablet, Take 2 tablets (20 mg total) by mouth 2 (two) times daily. (Patient not taking: Reported on 03/12/2021), Disp: 120 tablet, Rfl: 0   methocarbamol (ROBAXIN) 500 MG tablet, Take 1 tablet (500 mg total) by mouth 2 (two) times daily. (Patient not taking: Reported on 12/28/2019), Disp: 20 tablet, Rfl: 0   PROVENTIL HFA 108 (90 Base) MCG/ACT inhaler, INHALE 2 PUFFS Q 4-6 H PRN (Patient not taking: Reported on 12/28/2019), Disp: , Rfl: 0  Allergies as of 03/12/2021 - Review Complete 03/12/2021  Allergen Reaction Noted   Food  10/19/2012     reports that he has never smoked. He has never been exposed to tobacco smoke. He has never used smokeless tobacco. He reports that he does not drink alcohol and does not use drugs.  1. School and family: He graduated from high school on June 5th 2020. 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 .   2. Activities: He is doing a lot of walking.  3. Primary Care Provider: none  REVIEW OF SYSTEMS: There are no other significant problems involving David Campbell's other body systems.   Objective:  Vital Signs:  BP 140/90 (BP Location: Right Arm, Patient Position: Sitting)    Pulse 80    Ht 6' 0.01" (1.829 m)    Wt 267 lb (121.1 kg)    BMI 36.20 kg/m     Ht Readings from Last 3  Encounters:  03/12/21 6' 0.01" (1.829 m)  04/16/20 5' 11.54" (1.817 m)  05/16/19 6' (1.829 m) (81 %, Z= 0.86)*   * Growth percentiles are based on CDC (Boys, 2-20 Years) data.   Wt Readings from Last 3 Encounters:  03/12/21 267 lb (121.1 kg)  04/16/20 256 lb 12.8 oz (116.5 kg)  12/28/19 255 lb 12.8 oz (116 kg)   HC Readings from Last 3 Encounters:  No data found for [facility]   Body surface area is 2.48 meters squared.  Facility age limit for growth percentiles is 20 years. Facility age limit for growth percentiles is 20 years. Facility age limit for growth percentiles is 20 years.  PHYSICAL EXAM:  Constitutional: David Campbell appears healthy, but more obese. He has gained 11 additional pounds since his last visit. His BMI is  35 kg/m2. He is alert and bright. His affect and insight are normal. He is very Scientist, forensic. I always enjoy seeing him.  Eyes: There is no arcus or proptosis.  Mouth: The oropharynx appears normal. The tongue appears normal. There is normal oral moisture. There is no obvious gingivitis. He has no tongue tremor.  Neck: There are no bruits present. His anterior neck appears enlarged. The thyroid gland is still enlarged, at about 21+ grams in size.  Today the left lobe is larger than the right lobe. The consistency of the thyroid gland is relatively full. There is no thyroid tenderness to palpation. Lungs: The lungs are clear. Air movement is good. Heart: The heart rhythm and rate appear normal. Heart sounds S1 and S2 are normal. I do not appreciate any pathologic heart murmurs. Abdomen: The abdomen is enlarged. Bowel sounds are normal. The abdomen is soft and non-tender. There is no obviously palpable hepatomegaly, splenomegaly, or other masses.  Arms: Muscle mass appears appropriate for age.  Hands: There is a trace tremor. Phalangeal and metacarpophalangeal joints appear normal. Palms show 1-2+ palmar erythema.  Legs: Muscle mass appears appropriate for age. There is no edema.   Neurologic: Muscle strength is normal for age and gender  in both the upper and the lower extremities. Muscle tone appears normal. Sensation to touch is normal in the legs. Breast tissue: The breasts are fatty, but now only Tanner stage I; Areolae measured 40 mm bilaterally, compared with 45 mm on the right and 42 mm on the left at his last visit and with 43 mm on the left and 40 mm on the right at his prior visit.   LAB DATA: Results for orders placed or performed in visit on 03/12/21 (from the past 504 hour(s))  POCT Glucose (Device for Home Use)   Collection Time: 03/12/21  2:33 PM  Result Value Ref Range   Glucose Fasting, POC 94 70 - 99 mg/dL   POC Glucose     Labs 03/12/21: CBG 94; TSH , free T4, free T3, TSI ; CMP, and CBC pending  Labs 04/16/20: HbA1c 5.1%, CBG 105  Labs 04/15/20: TSH 0.03, free T4 1.2, free T3 4.1; CMP normal; CBC normal, except RBC elevated at 5.99 (ref 4.20-5.80) and MCH low at 26.0 (ref 27-33);   Labs 12/28/19: HbA1c 5.2%, CBG 102  Labs 12/26/19: TSH 0.01, free T4 1.5, free T3 4.7, TSI pending  Labs 05/16/19: TSH <0.010  Labs 01/23/19: HbA1c 5.3%, CBG 109; TSH 0.34, free T4 1.4, free T3 4.4, TSI 213; CMP normal; CBC normal, except his RBC count was slightly elevated at 5.81 (ref 4.20-5.80)  Labs 09/05/08: TSH 0.28, free T4 1.5, free T3 4.2, TSI 178  Labs 02/02/18: HbA1c 5.5%, CBG 103  Labs 01/31/18: TSH 1.59, free T4 1.3, free T3 3.5, TSI 191  Labs 08/24/17: HbA1c 5.5%, CBG 81  Labs 08/19/17: TSH 1.66, free T4 1.4, free T3 3.8, TSI 189  Labs 04/12/17: HbA1c 5.5%, CBG 115  Labs 04/08/17: TSH 1.94, free T4 1.5, free T3 4.1, TSI 454  Labs 01/20/17: TSH 2.51, free T4 1.4, free T3 3.6, TSI 480  Labs 10/20/16: HbA1c 5.4%, CBG 113  Labs 09/19/16: TSH 2.58, free T4 1.4, free T3 3.8, TSI 213  Labs 06/22/16: HbA1c 5.3%, CBG 91  Labs 06/17/16: TSH 1.11, free T4 1.4, free T3 4.1, TSI 286 (ref <140, actually <100)  Labs 03/13/16: HbA1c 5.7%; TSH 1.41, free T4 1.2,  free T3 3.5, TSI 349  Labs 12/16/15:  HbA1c 5.7%  Labs 12/11/15: TSH 3.37, free T4 1.4, free T3 4.1  Labs 07/24/15: HbA1c 5.5%  Labs 05/29/15: TSH 0.23, free T4 1.2, free T3 3.7; TSI 477; CMP normal; CBC normal except for MCV 77.2, iron 106  Labs 04/11/15: HbA1c 5.4%; WBC 6.3, RBC 5.63 (normal 3.80-5.20), hemoglobin 14.3, hematocrit 42.4 %, MCV 75.3 (normal 77-95), neutrophils 3.0; CMP normal with AST 21 and ALT 27; TSH <0.01, free T4 1.4, free T3 4.8, TSI 481  Labs 03/05/15: CBG 125, HbA1c 5.2%  Labs 02/28/15: TSH <0.01, free T4 2.1, free T3 9.3, TSI 434; testosterone 217, estradiol 32  Labs 08/30/14: HbA1c 5.3%  Labs 08/24/14: TSH 1.756, free T4 1.40, free T3 4.1, TSI 135; testosterone 187, estradiol 27.8  Labs 05/15/14: TSH 1.247, free T4 1.18, free T3 4.3, TSI 120 (normal by lab is < 140); testosterone 234, estradiol 19.8; HbA1c 5.7%  Labs 12/21/13: HbA1c 5.4%, compared to 5.2% at last visit.  Labs 12/14/13:  TSH 1.063, free T4 1.02, free T3 4.1,TSI 123; testosterone 176, estradiol 13.9  Labs 07/04/13: TSH 1.056, free T4 1.22, free T3 4.5, TSI 60; testosterone 95, estradiol < 11.8  Labs 01/27/12: TSH 2.769, free T4 1.28, free T3 3.7; testosterone 54, estradiol < 11.8  Labs 09/08/12: [facility] can't find the lab results. David Campbell has no record of any labs done since June.  Labs 07/01/12: TSH 1.424, free T4 1.33, free T3 3.8  Labs 02/23/12: TSH 1.999, free T4 1.26, free T3 3.7, IGF-1 pending, IGFBP-3 pending   Assessment and Plan:   ASSESSMENT:  1-2. Thyrotoxicosis with diffuse goiter, secondary to Graves disease/Hashimoto's thyroiditis.:   A. David Campbell's Graves' disease and his Hashimoto's disease have been battling each other since 2006. At times his TSI and Graves' disease activity have been higher, causing higher TFTS and more signs and symptoms of Graves' disease and requiring higher doses of methimazole (MTZ) for treatment. At other times, however, he has had more signs and symptoms of  Hashimoto's disease, his TFTs have been lower, and we have reduced his MTZ doses.   B. He was clinically nearly euthyroid in January 2021, but was still chemically mildly hyperthyroid. Unfortunately, he has since become more hyperthyroid and has needed increases in his MTZ dose. He needed a further increase in the MTZ dose December 2021 to 20 mg, twice daily. He had also had some active thyroiditis recently prior to that December visit.   C. At his visit in April 2022, he felt good. His goiter was a bit smaller. His heart rate was normal. He did have a hand tremor and palmar erythema. His free T4 and free T3 were lower and within normal limits. His TSH was still suppressed, but higher.   D. In March 2023 he has been without MTZ for about one month. He has a few hyperthyroid symptoms, a trace tremor, and 1-2+ palmar erythema. He appears to be hyperthyroid, but probably less so than 11 months ago.  E. The battle between his Graves' Dz B lymphocytes and his Hashimoto's Dz T lymphocytes continues. In all likelihood, the T cells will eventually win out. .  3. Prediabetes:   A. Since he had had three previous HbA1c values in the prediabetic range, we officially diagnosed him as having prediabetes. However, he then had five HbA1c values that were normal. Unfortunately, as he gained more weight his HbA1c values increased toward the prediabetic range. His January 2020 HbA1c level was again high-normal for his age.     B. It  appears that since last visit David Campbell had been serious about eating right and about exercising. He has gained 11 pounds. However, his breast tissue is less, which suggests that some of his weight gain is muscle.   4. Morbid obesity: His weight has increased more.   5. Large breasts/Gynecomastia: The glandular tissue has decreased over time. His areolae are smaller.   6. Hypertension: His BP is elevated today, paralleling his weight gain. He needs to do more cardio and lose more fat. We will  consider starting hypertensive medication at his next visit if the BP is not improved. 7-8. Tremor/palmar erythema: He has a trace tremor and 1-2+ palmar erythema today, c/w still being hyperthyroid.   PLAN:   1. Diagnostic: TFTs, TSI, CMP, CBC were [performed earlier today.  2. Therapeutic: Consider re-starting or modifying his MTZ doses of 20 mg twice daily. Reduce caloric drinks. Eat Right. Exercise for an hour or more per day.  We will start BP medication as needed.  3. Patient education: Discussed his clinical exam and previous TFT results, increased weight, and overall thyroid hormone status. Continue to focus on reducing caloric drink intake, consuming less starch and sugar, and exercising for one hour per day or more.  4. Follow-up: 3 months   Level of Service: This visit lasted in excess of 50 minutes. More than 50% of the visit was devoted to counseling.  Sherrlyn Hock, MD, CDE Pediatric [facility] Adult [facility]inology

## 2021-03-12 ENCOUNTER — Other Ambulatory Visit: Payer: Self-pay

## 2021-03-12 ENCOUNTER — Encounter (INDEPENDENT_AMBULATORY_CARE_PROVIDER_SITE_OTHER): Payer: Self-pay | Admitting: "Endocrinology

## 2021-03-12 ENCOUNTER — Ambulatory Visit (INDEPENDENT_AMBULATORY_CARE_PROVIDER_SITE_OTHER): Payer: Federal, State, Local not specified - PPO | Admitting: "Endocrinology

## 2021-03-12 VITALS — BP 140/90 | HR 80 | Ht 72.01 in | Wt 267.0 lb

## 2021-03-12 DIAGNOSIS — E8881 Metabolic syndrome: Secondary | ICD-10-CM

## 2021-03-12 DIAGNOSIS — R7303 Prediabetes: Secondary | ICD-10-CM

## 2021-03-12 DIAGNOSIS — E063 Autoimmune thyroiditis: Secondary | ICD-10-CM

## 2021-03-12 DIAGNOSIS — R1013 Epigastric pain: Secondary | ICD-10-CM

## 2021-03-12 DIAGNOSIS — I10 Essential (primary) hypertension: Secondary | ICD-10-CM

## 2021-03-12 DIAGNOSIS — E0500 Thyrotoxicosis with diffuse goiter without thyrotoxic crisis or storm: Secondary | ICD-10-CM | POA: Diagnosis not present

## 2021-03-12 DIAGNOSIS — N62 Hypertrophy of breast: Secondary | ICD-10-CM

## 2021-03-12 DIAGNOSIS — L83 Acanthosis nigricans: Secondary | ICD-10-CM

## 2021-03-12 LAB — POCT GLUCOSE (DEVICE FOR HOME USE): Glucose Fasting, POC: 94 mg/dL (ref 70–99)

## 2021-03-12 NOTE — Patient Instructions (Signed)
Follow up visit in 3 months.   At [facility], we are committed to providing exceptional care. You will receive a patient satisfaction survey through text or email regarding your visit today. Your opinion is important to me. Comments are appreciated.   

## 2021-03-14 LAB — CBC WITH DIFFERENTIAL/PLATELET
Absolute Monocytes: 402 cells/uL (ref 200–950)
Basophils Absolute: 48 cells/uL (ref 0–200)
Basophils Relative: 0.8 %
Eosinophils Absolute: 192 cells/uL (ref 15–500)
Eosinophils Relative: 3.2 %
HCT: 47.1 % (ref 38.5–50.0)
Hemoglobin: 15.5 g/dL (ref 13.2–17.1)
Lymphs Abs: 1914 cells/uL (ref 850–3900)
MCH: 26.4 pg — ABNORMAL LOW (ref 27.0–33.0)
MCHC: 32.9 g/dL (ref 32.0–36.0)
MCV: 80.2 fL (ref 80.0–100.0)
MPV: 10.3 fL (ref 7.5–12.5)
Monocytes Relative: 6.7 %
Neutro Abs: 3444 cells/uL (ref 1500–7800)
Neutrophils Relative %: 57.4 %
Platelets: 277 10*3/uL (ref 140–400)
RBC: 5.87 10*6/uL — ABNORMAL HIGH (ref 4.20–5.80)
RDW: 13.2 % (ref 11.0–15.0)
Total Lymphocyte: 31.9 %
WBC: 6 10*3/uL (ref 3.8–10.8)

## 2021-03-14 LAB — COMPREHENSIVE METABOLIC PANEL
AG Ratio: 1.7 (calc) (ref 1.0–2.5)
ALT: 22 U/L (ref 9–46)
AST: 17 U/L (ref 10–40)
Albumin: 4.4 g/dL (ref 3.6–5.1)
Alkaline phosphatase (APISO): 53 U/L (ref 36–130)
BUN: 16 mg/dL (ref 7–25)
CO2: 23 mmol/L (ref 20–32)
Calcium: 9.4 mg/dL (ref 8.6–10.3)
Chloride: 105 mmol/L (ref 98–110)
Creat: 1 mg/dL (ref 0.60–1.24)
Globulin: 2.6 g/dL (calc) (ref 1.9–3.7)
Glucose, Bld: 83 mg/dL (ref 65–139)
Potassium: 4.3 mmol/L (ref 3.5–5.3)
Sodium: 137 mmol/L (ref 135–146)
Total Bilirubin: 0.9 mg/dL (ref 0.2–1.2)
Total Protein: 7 g/dL (ref 6.1–8.1)

## 2021-03-14 LAB — T4, FREE: Free T4: 1.3 ng/dL (ref 0.8–1.8)

## 2021-03-14 LAB — TSH: TSH: 0.53 mIU/L (ref 0.40–4.50)

## 2021-03-14 LAB — T3, FREE: T3, Free: 4.3 pg/mL — ABNORMAL HIGH (ref 2.3–4.2)

## 2021-03-14 LAB — THYROID STIMULATING IMMUNOGLOBULIN: TSI: 131 % baseline (ref <140)

## 2021-03-20 ENCOUNTER — Telehealth (INDEPENDENT_AMBULATORY_CARE_PROVIDER_SITE_OTHER): Payer: Self-pay

## 2021-03-20 DIAGNOSIS — E0500 Thyrotoxicosis with diffuse goiter without thyrotoxic crisis or storm: Secondary | ICD-10-CM

## 2021-03-20 MED ORDER — METHIMAZOLE 10 MG PO TABS: 10.0000 mg | ORAL_TABLET | Freq: Two times a day (BID) | ORAL | 5 refills | Status: DC

## 2021-03-20 NOTE — Telephone Encounter (Signed)
-----   Message from David Stall, MD sent at 03/17/2021 11:28 PM EST ----- ?Thyroid tests were at the top end of the normal range. TSI was technically at the upper end of the normal range, was lower than two years ago, but was actually still too high, c/w mild Graves' disease activity. WE should re-start Joselyn Glassman on methimazole, but at a lower dose, 10 mg, twice daily. ?CMP was normal.   ?CBC was fairly normal, but the RBC count was a bit elevated and the amount of hemoglobin in the RBC was a bit low. We need to check Hyde's iron level in the future.  ? ?Clinical staff; Please submit a prescription for methimazole, 10 mg, twice daily, 60 tablets per month for 6 months. Thanks. ?Dr.Brennan ?

## 2021-03-20 NOTE — Telephone Encounter (Signed)
Called. LVM with call back number. Sent in Rx.  ?

## 2021-06-13 ENCOUNTER — Ambulatory Visit (INDEPENDENT_AMBULATORY_CARE_PROVIDER_SITE_OTHER): Payer: Federal, State, Local not specified - PPO | Admitting: "Endocrinology

## 2021-06-13 NOTE — Progress Notes (Deleted)
Subjective:  Patient Name: Chanoch Mccleery Date of Birth: [birth year]  MRN: [id number]  Henryk Ursin  presents at his clinic visit today for follow-up evaluation and management of his Graves' disease, Hashimoto's disease, obesity, hypertension, acanthosis nigricans, dyspepsia, prediabetes, and gynecomastia.  HISTORY OF PRESENT ILLNESS:   Jenaro is a 21 y.o. African-American young man.  Calel was accompanied by his significant other, [name]. ([id number])    1. Regino was first seen in our clinic on 01/21/04 for evaluation and management of hyperthyroidism. He was then 21 years old. Demarcus had presented to Dr. Donnie Coffin previously with a history of diarrhea and increased heart rate beginning in October 2005. The child's activity level had been very high and he had been having night sweats. Mother  noted a goiter in the preceding week. On 01/18/04 the TSH was 0.004. Free T4 was greater than 12. CMP and CBC were normal. Wellington was started on methimazole (MTZ), 5 mg per day. His TSI was positive as were his TPO antibody and thyroglobulin antibody, indicating that he had both Graves' disease and Hashimoto's disease.     2. Since then, Seville's TFTs have fluctuated back and forth between hyperthyroidism and hypothyroidism. His methimazole doses have varied between 2.5-25 mg/day. Although his Hashimoto's disease has generally been clinically quiescent, his Hashimoto's T cells have gradually destroyed some of his thyrocytes over time and we have been able to gradually taper the methimazole dose over time as well. Unfortunately, he has also had several recurrences of increased TSI levels and worsening hyperthyroidism as well.   3. Vishruth's last [facility] visit was on 03/12/21. At that visit I re-started MTZ at 10 mg, twice daily.    A. In the interim he has been healthy. He ran out of the MTZ about one month ago. He has had some heart palpitations. He has not been jittery, but may be a bit hyper.  He has been warmer and sweating more. He does not have any difficulties with sleeping. He has been working out and losing a lot of weight.  B. He no longer has pains in the right TMJ area. He has not been having intermittent pains in his thyroid gland.   C. He says that he has been eating somewhat healthier and eating less. He is doing a lot of his own cooking. He works out more.     4. Pertinent Review of Systems:  Constitutional: Sanad feels "good". His allergies have not been bothering him much so far this year. His energy level is "good". He has enough energy to do all the things he wants to do. He no longer gets tired very often.  Eyes: Vision seems to be great. There are no recognized eye problems. He can look up and out without ay pressure symptoms.  Neck: As above. There are no recognized symptoms of the anterior neck.  Heart: As above. There are no recognized heart problems. The ability to perform his usual ADLs and do other physical activities seems normal.  Gastrointestinal: He says he has less belly hunger. Bowel movents seem normal. There are no other recognized GI problems. Hands: No tremor or other problems Legs: Muscle mass and strength seem normal. He has not had any problems. No edema is noted.  Feet: There are no obvious foot problems. No edema is noted.  Neurologic: There are no recognized problems with muscle movement and strength, sensation, or coordination. Breasts: Maybe a little smaller  Skin: No pruritus  PAST MEDICAL, FAMILY, AND SOCIAL HISTORY  Past Medical History:  Diagnosis Date   ADHD (attention deficit hyperactivity disorder)    Asthma, chronic    Delayed linear growth    Environmental allergies    Graves disease    Hashimoto's disease    Tachycardia    Thyrotoxicosis with diffuse goiter     Family History  Problem Relation Age of Onset   Thyroid disease Mother        Mother had Graves' disease at age 55. She was treated with PTU for 4 years, then  went into remission without medication.   Heart disease Maternal Grandmother    Thyroid disease Maternal Grandfather        Maternal grandfather had Graves' disease for which he was treated with partial thyroidectomy. When recurrence of Graves' disease occurred, he received high-131 therapy   Diabetes Paternal Grandmother      Current Outpatient Medications:    cetirizine (ZYRTEC) 10 MG tablet, Take 10 mg by mouth daily., Disp: , Rfl:    fluticasone (FLONASE) 50 MCG/ACT nasal spray, Place 2 sprays into the nose daily. Reported on 03/05/2015 (Patient not taking: Reported on 12/28/2019), Disp: , Rfl:    methimazole (TAPAZOLE) 10 MG tablet, Take 1 tablet (10 mg total) by mouth 2 (two) times daily., Disp: 60 tablet, Rfl: 5   methocarbamol (ROBAXIN) 500 MG tablet, Take 1 tablet (500 mg total) by mouth 2 (two) times daily. (Patient not taking: Reported on 12/28/2019), Disp: 20 tablet, Rfl: 0   PROVENTIL HFA 108 (90 Base) MCG/ACT inhaler, INHALE 2 PUFFS Q 4-6 H PRN (Patient not taking: Reported on 12/28/2019), Disp: , Rfl: 0  Allergies as of 06/13/2021 - Review Complete 03/12/2021  Allergen Reaction Noted   Food  10/19/2012     reports that he has never smoked. He has never been exposed to tobacco smoke. He has never used smokeless tobacco. He reports that he does not drink alcohol and does not use drugs.  1. School and family: He graduated from high school on June 5th 2020. 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 .   2. Activities: He is doing a lot of walking.  3. Primary Care Provider: none  REVIEW OF SYSTEMS: There are no other significant problems involving Caleel's other body systems.   Objective:  Vital Signs:  There were no vitals taken for this visit.    Ht Readings from Last 3 Encounters:  03/12/21 6' 0.01" (1.829 m)  04/16/20 5' 11.54" (1.817 m)  05/16/19 6' (1.829 m) (81 %, Z= 0.86)*   * Growth percentiles are based on CDC (Boys, 2-20 Years) data.    Wt Readings from Last 3 Encounters:  03/12/21 267 lb (121.1 kg)  04/16/20 256 lb 12.8 oz (116.5 kg)  12/28/19 255 lb 12.8 oz (116 kg)   HC Readings from Last 3 Encounters:  No data found for HC   There is no height or weight on file to calculate BSA.  Facility age limit for growth percentiles is 20 years. Facility age limit for growth percentiles is 20 years. Facility age limit for growth percentiles is 20 years.  PHYSICAL EXAM:  Constitutional: Azir appears healthy, but more obese. He has gained 11 additional pounds since his last visit. His BMI is 35 kg/m2. He is alert and bright. His affect and insight are normal. He is very Teacher, English as a foreign language. I always enjoy seeing him.  Eyes: There is no arcus or proptosis.  Mouth: The oropharynx appears normal. The tongue appears normal.  There is normal oral moisture. There is no obvious gingivitis. He has no tongue tremor.  Neck: There are no bruits present. His anterior neck appears enlarged. The thyroid gland is still enlarged, at about 21+ grams in size.  Today the left lobe is larger than the right lobe. The consistency of the thyroid gland is relatively full. There is no thyroid tenderness to palpation. Lungs: The lungs are clear. Air movement is good. Heart: The heart rhythm and rate appear normal. Heart sounds S1 and S2 are normal. I do not appreciate any pathologic heart murmurs. Abdomen: The abdomen is enlarged. Bowel sounds are normal. The abdomen is soft and non-tender. There is no obviously palpable hepatomegaly, splenomegaly, or other masses.  Arms: Muscle mass appears appropriate for age.  Hands: There is a trace tremor. Phalangeal and metacarpophalangeal joints appear normal. Palms show 1-2+ palmar erythema.  Legs: Muscle mass appears appropriate for age. There is no edema.  Neurologic: Muscle strength is normal for age and gender  in both the upper and the lower extremities. Muscle tone appears normal. Sensation to touch is normal in the  legs. Breast tissue: The breasts are fatty, but now only Tanner stage I; Areolae measured 40 mm bilaterally, compared with 45 mm on the right and 42 mm on the left at his last visit and with 43 mm on the left and 40 mm on the right at his prior visit.   LAB DATA: No results found for this or any previous visit (from the past 504 hour(s)).  Labs 03/12/21: CBG 94; TSH 0.53, free T4 1.3, free T3 4.3, TSI 131; CMP normal, and CBC normal, except RBC 5.87 (ref 4.420-5.80) and MCH 26.4 (ref 27-33) Labs 04/16/20: HbA1c 5.1%, CBG 105  Labs 04/15/20: TSH 0.03, free T4 1.2, free T3 4.1; CMP normal; CBC normal, except RBC elevated at 5.99 (ref 4.20-5.80) and MCH low at 26.0 (ref 27-33);   Labs 12/28/19: HbA1c 5.2%, CBG 102  Labs 12/26/19: TSH 0.01, free T4 1.5, free T3 4.7, TSI pending  Labs 05/16/19: TSH <0.010  Labs 01/23/19: HbA1c 5.3%, CBG 109; TSH 0.34, free T4 1.4, free T3 4.4, TSI 213; CMP normal; CBC normal, except his RBC count was slightly elevated at 5.81 (ref 4.20-5.80)  Labs 09/05/08: TSH 0.28, free T4 1.5, free T3 4.2, TSI 178  Labs 02/02/18: HbA1c 5.5%, CBG 103  Labs 01/31/18: TSH 1.59, free T4 1.3, free T3 3.5, TSI 191  Labs 08/24/17: HbA1c 5.5%, CBG 81  Labs 08/19/17: TSH 1.66, free T4 1.4, free T3 3.8, TSI 189  Labs 04/12/17: HbA1c 5.5%, CBG 115  Labs 04/08/17: TSH 1.94, free T4 1.5, free T3 4.1, TSI 454  Labs 01/20/17: TSH 2.51, free T4 1.4, free T3 3.6, TSI 480  Labs 10/20/16: HbA1c 5.4%, CBG 113  Labs 09/19/16: TSH 2.58, free T4 1.4, free T3 3.8, TSI 213  Labs 06/22/16: HbA1c 5.3%, CBG 91  Labs 06/17/16: TSH 1.11, free T4 1.4, free T3 4.1, TSI 286 (ref <140, actually <100)  Labs 03/13/16: HbA1c 5.7%; TSH 1.41, free T4 1.2, free T3 3.5, TSI 349  Labs 12/16/15: HbA1c 5.7%  Labs 12/11/15: TSH 3.37, free T4 1.4, free T3 4.1  Labs 07/24/15: HbA1c 5.5%  Labs 05/29/15: TSH 0.23, free T4 1.2, free T3 3.7; TSI 477; CMP normal; CBC normal except for MCV 77.2, iron 106  Labs 04/11/15:  HbA1c 5.4%; WBC 6.3, RBC 5.63 (normal 3.80-5.20), hemoglobin 14.3, hematocrit 42.4 %, MCV 75.3 (normal 77-95), neutrophils 3.0; CMP normal with  AST 21 and ALT 27; TSH <0.01, free T4 1.4, free T3 4.8, TSI 481  Labs 03/05/15: CBG 125, HbA1c 5.2%  Labs 02/28/15: TSH <0.01, free T4 2.1, free T3 9.3, TSI 434; testosterone 217, estradiol 32  Labs 08/30/14: HbA1c 5.3%  Labs 08/24/14: TSH 1.756, free T4 1.40, free T3 4.1, TSI 135; testosterone 187, estradiol 27.8  Labs 05/15/14: TSH 1.247, free T4 1.18, free T3 4.3, TSI 120 (normal by lab is < 140); testosterone 234, estradiol 19.8; HbA1c 5.7%  Labs 12/21/13: HbA1c 5.4%, compared to 5.2% at last visit.  Labs 12/14/13:  TSH 1.063, free T4 1.02, free T3 4.1,TSI 123; testosterone 176, estradiol 13.9  Labs 07/04/13: TSH 1.056, free T4 1.22, free T3 4.5, TSI 60; testosterone 95, estradiol < 11.8  Labs 01/27/12: TSH 2.769, free T4 1.28, free T3 3.7; testosterone 54, estradiol < 11.8  Labs 09/08/12: [facility] can't find the lab results. [facility]  Labs 07/01/12: TSH 1.424, free T4 1.33, free T3 3.8  Labs 02/23/12: TSH 1.999, free T4 1.26, free T3 3.7, IGF-1 pending, IGFBP-3 pending   Assessment and Plan:   ASSESSMENT:  1-2. Thyrotoxicosis with diffuse goiter, secondary to Graves disease/Hashimoto's thyroiditis.:   A. Jac's Graves' disease and his Hashimoto's disease have been battling each other since 2006. At times his TSI and Graves' disease activity have been higher, causing higher TFTS and more signs and symptoms of Graves' disease and requiring higher doses of methimazole (MTZ) for treatment. At other times, however, he has had more signs and symptoms of Hashimoto's disease, his TFTs have been lower, and we have reduced his MTZ doses.   B. He was clinically nearly euthyroid in January 2021, but was still chemically mildly hyperthyroid. Unfortunately, he has since become more hyperthyroid and has needed increases  in his MTZ dose. He needed a further increase in the MTZ dose December 2021 to 20 mg, twice daily. He had also had some active thyroiditis recently prior to that December visit.   C. At his visit in April 2022, he felt good. His goiter was a bit smaller. His heart rate was normal. He did have a hand tremor and palmar erythema. His free T4 and free T3 were lower and within normal limits. His TSH was still suppressed, but higher.   D. In March 2023 he has been without MTZ for about one month. He has a few hyperthyroid symptoms, a trace tremor, and 1-2+ palmar erythema. He appears to be hyperthyroid, but probably less so than 11 months ago.  E. The battle between his Graves' Dz B lymphocytes and his Hashimoto's Dz T lymphocytes continues. In all likelihood, the T cells will eventually win out. .  3. Prediabetes:   A. Since he had had three previous HbA1c values in the prediabetic range, we officially diagnosed him as having prediabetes. However, he then had five HbA1c values that were normal. Unfortunately, as he gained more weight his HbA1c values increased toward the prediabetic range. His January 2020 HbA1c level was again high-normal for his age.     B. It appears that since last visit Joselyn Glassmanyler had been serious about eating right and about exercising. He has gained 11 pounds. However, his breast tissue is less, which suggests that some of his weight gain is muscle.   4. Morbid obesity: His weight has increased more.   5. Large breasts/Gynecomastia: The glandular tissue has decreased over time. His areolae are smaller.   6. Hypertension:  His BP is elevated today, paralleling his weight gain. He needs to do more cardio and lose more fat. We will consider starting hypertensive medication at his next visit if the BP is not improved. 7-8. Tremor/palmar erythema: He has a trace tremor and 1-2+ palmar erythema today, c/w still being hyperthyroid.   PLAN:   1. Diagnostic: TFTs, TSI, CMP, CBC were [performed  earlier today.  2. Therapeutic: Consider re-starting or modifying his MTZ doses of 20 mg twice daily. Reduce caloric drinks. Eat Right. Exercise for an hour or more per day.  We will start BP medication as needed.  3. Patient education: Discussed his clinical exam and previous TFT results, increased weight, and overall thyroid hormone status. Continue to focus on reducing caloric drink intake, consuming less starch and sugar, and exercising for one hour per day or more.  4. Follow-up: 3 months   Level of Service: This visit lasted in excess of 50 minutes. More than 50% of the visit was devoted to counseling.  David Stall, MD, CDE Pediatric and Adult Endocrinology

## 2021-07-22 ENCOUNTER — Other Ambulatory Visit: Payer: Self-pay

## 2021-07-22 ENCOUNTER — Encounter (HOSPITAL_BASED_OUTPATIENT_CLINIC_OR_DEPARTMENT_OTHER): Payer: Self-pay | Admitting: *Deleted

## 2021-07-22 ENCOUNTER — Emergency Department (HOSPITAL_BASED_OUTPATIENT_CLINIC_OR_DEPARTMENT_OTHER)
Admission: EM | Admit: 2021-07-22 | Discharge: 2021-07-22 | Disposition: A | Discharge status: Home / Self Care | Payer: Federal, State, Local not specified - PPO | Attending: Emergency Medicine | Admitting: Emergency Medicine

## 2021-07-22 DIAGNOSIS — R0602 Shortness of breath: Secondary | ICD-10-CM | POA: Insufficient documentation

## 2021-07-22 DIAGNOSIS — R251 Tremor, unspecified: Secondary | ICD-10-CM | POA: Diagnosis not present

## 2021-07-22 LAB — CBG MONITORING, ED: Glucose-Capillary: 91 mg/dL (ref 70–99)

## 2021-07-22 NOTE — ED Provider Notes (Signed)
[facility] EMERGENCY DEPARTMENT Provider Note   CSN: [id number] Arrival date & time: 07/22/21  1840     History  Chief Complaint  Patient presents with   Shortness of Breath    David Campbell is a 21 y.o. male.  Patient presents ER chief complaint of feeling jittery and shaky.  She has not patient states shortness of breath however he denies that he states that he just felt jittery and his left hand was shaking.  He states he was using a weed eater all day today and afterwards his left hand was shaking.  He had recently been taken off of his methimazole for Graves' disease.  He is concerned that his thyroid levels may be up again.  Otherwise denies headache or chest pain abdominal pain no fever no cough no vomiting or diarrhea.       Home Medications Prior to Admission medications   Medication Sig Start Date End Date Taking? Authorizing Provider  cetirizine (ZYRTEC) 10 MG tablet Take 10 mg by mouth daily.    [provider]  fluticasone (FLONASE) 50 MCG/ACT nasal spray Place 2 sprays into the nose daily. Reported on 03/05/2015 Patient not taking: Reported on 12/28/2019    [provider]  methimazole (TAPAZOLE) 10 MG tablet Take 1 tablet (10 mg total) by mouth 2 (two) times daily. 03/20/21 09/16/21  David Stall, MD  methocarbamol (ROBAXIN) 500 MG tablet Take 1 tablet (500 mg total) by mouth 2 (two) times daily. Patient not taking: Reported on 12/28/2019 05/16/19   Mare Ferrari, PA-C  PROVENTIL HFA 108 [phone] Base) MCG/ACT inhaler INHALE 2 PUFFS Q 4-6 H PRN Patient not taking: Reported on 12/28/2019 03/09/17   [provider]      Allergies    Food    Review of Systems   Review of Systems  Constitutional:  Negative for fever.  HENT:  Negative for ear pain and sore throat.   Eyes:  Negative for pain.  Respiratory:  Negative for cough.   Cardiovascular:  Negative for chest pain.  Gastrointestinal:  Negative for abdominal pain.   Genitourinary:  Negative for flank pain.  Musculoskeletal:  Negative for back pain.  Skin:  Negative for color change and rash.  Neurological:  Negative for syncope.  All other systems reviewed and are negative.   Physical Exam Updated Vital Signs BP 139/80   Pulse 63   Temp 98 F (36.7 C) (Oral)   Resp 18   Ht 6\' 1"  (1.854 m)   Wt 117.9 kg   SpO2 99%   BMI 34.30 kg/m  Physical Exam Constitutional:      Appearance: He is well-developed.  HENT:     Head: Normocephalic.     Nose: Nose normal.  Eyes:     Extraocular Movements: Extraocular movements intact.  Cardiovascular:     Rate and Rhythm: Normal rate.  Pulmonary:     Effort: Pulmonary effort is normal.  Musculoskeletal:     Comments: Equivocal fine tremor perhaps seen in the left hand.  Otherwise neurovascularly intact.  Compartments are soft.  Skin:    Coloration: Skin is not jaundiced.  Neurological:     General: No focal deficit present.     Mental Status: He is alert. Mental status is at baseline.     ED Results / Procedures / Treatments   Labs (all labs ordered are listed, but only abnormal results are displayed) Labs Reviewed  CBG MONITORING, ED  CBG MONITORING, ED  EKG EKG Interpretation  Date/Time:  Tuesday July 22 2021 18:56:05 EDT Ventricular Rate:  77 PR Interval:  178 QRS Duration: 84 QT Interval:  360 QTC Calculation: 407 R Axis:   32 Text Interpretation: Normal sinus rhythm Cannot rule out Anterior infarct , age undetermined Abnormal ECG When compared with ECG of 16-May-2019 17:45, PREVIOUS ECG IS PRESENT Confirmed by Norman Clay ([id number]) on 07/22/2021 9:38:31 PM  Radiology No results found.  Procedures Procedures    Medications Ordered in ED Medications - No data to display  ED Course/ Medical Decision Making/ A&P                           Medical Decision Making Amount and/or Complexity of Data Reviewed ECG/medicine tests: ordered.   History from family  bedside.  Review of record shows office visit March 15, 2021 for Graves' disease.  Blood glucose was normal.  EKG shows sinus rhythm no ST elevations depressions normal rate.  No T wave inversions noted.  Patient does not appear to be in thyroid storm appears comfortable and calm.  We discussed emergent labs here today, joint decision made to forego labs, family seems agreeable to the plan.  Discharged home in stable condition, advised outpatient follow-up with his endocrinologist within the week, advised immediate return for worsening symptoms or any additional concerns.        Final Clinical Impression(s) / ED Diagnoses Final diagnoses:  Tremor of left hand    Rx / DC Orders ED Discharge Orders     None         Cheryll Cockayne, MD 07/22/21 2148

## 2021-07-22 NOTE — Discharge Instructions (Signed)
Follow-up with the endocrinologist by calling their office tomorrow.  Avoid strenuous activity involving hands for the next 48 hours.  Return back to the ER if you have worsening symptoms palpitations rapid heart rate or any additional concerns.

## 2021-07-22 NOTE — ED Triage Notes (Addendum)
Here for evaluation for "heart beating real fast", had the shakes as well. Was reading at the time of onset. Has some episode of shortness of breath

## 2021-07-28 ENCOUNTER — Encounter (INDEPENDENT_AMBULATORY_CARE_PROVIDER_SITE_OTHER): Payer: Self-pay | Admitting: "Endocrinology

## 2021-07-28 ENCOUNTER — Other Ambulatory Visit (INDEPENDENT_AMBULATORY_CARE_PROVIDER_SITE_OTHER): Payer: Self-pay

## 2021-07-28 ENCOUNTER — Ambulatory Visit (INDEPENDENT_AMBULATORY_CARE_PROVIDER_SITE_OTHER): Payer: Federal, State, Local not specified - PPO | Admitting: "Endocrinology

## 2021-07-28 VITALS — BP 122/80 | HR 96 | Wt 272.0 lb

## 2021-07-28 DIAGNOSIS — E063 Autoimmune thyroiditis: Secondary | ICD-10-CM

## 2021-07-28 DIAGNOSIS — Z68.41 Body mass index (BMI) pediatric, greater than or equal to 95th percentile for age: Secondary | ICD-10-CM

## 2021-07-28 DIAGNOSIS — R7303 Prediabetes: Secondary | ICD-10-CM

## 2021-07-28 DIAGNOSIS — I10 Essential (primary) hypertension: Secondary | ICD-10-CM

## 2021-07-28 DIAGNOSIS — L538 Other specified erythematous conditions: Secondary | ICD-10-CM

## 2021-07-28 DIAGNOSIS — E0500 Thyrotoxicosis with diffuse goiter without thyrotoxic crisis or storm: Secondary | ICD-10-CM

## 2021-07-28 DIAGNOSIS — E669 Obesity, unspecified: Secondary | ICD-10-CM | POA: Diagnosis not present

## 2021-07-28 DIAGNOSIS — R251 Tremor, unspecified: Secondary | ICD-10-CM

## 2021-07-28 LAB — POCT GLUCOSE (DEVICE FOR HOME USE): Glucose Fasting, POC: 82 mg/dL (ref 70–99)

## 2021-07-28 LAB — POCT GLYCOSYLATED HEMOGLOBIN (HGB A1C): Hemoglobin A1C: 5 % (ref 4.0–5.6)

## 2021-07-28 NOTE — Progress Notes (Signed)
Subjective:  Patient Name: David Campbell Date of Birth: [birth year]  MRN: [id number]  David Campbell  presents at his clinic visit today for follow-up evaluation and management of his Graves' disease, Hashimoto's disease, obesity, hypertension, acanthosis nigricans, dyspepsia, prediabetes, and gynecomastia.  HISTORY OF PRESENT ILLNESS:   David Campbell is a 21 y.o. African-American young man.  David Campbell was accompanied by his significant other, [name]. ([id number])    1. David Campbell was first seen in our clinic on 01/21/04 for evaluation and management of hyperthyroidism. He was then 21 years old. [name] had presented to Dr. Donnie Coffin previously with a history of diarrhea and increased heart rate beginning in October 2005. The child's activity level had been very high and he had been having night sweats. Mother  noted a goiter in the preceding week. On 01/18/04 the TSH was 0.004. Free T4 was greater than 12. CMP and CBC were normal. [name] was started on methimazole (MTZ), 5 mg per day. His TSI was positive as were his TPO antibody and thyroglobulin antibody, indicating that he had both Graves' disease and Hashimoto's disease.     2. Clinical course: A. Over the years, David Campbell's TFTs have fluctuated back and forth between hyperthyroidism and hypothyroidism. His methimazole doses have varied between 2.5-25 mg/day. Although his Hashimoto's disease has generally been clinically quiescent, his Hashimoto's T cells have gradually destroyed some of his thyrocytes over time and we have been able to gradually taper the methimazole dose over time as well. Unfortunately, he has also had several recurrences of increased TSI levels and worsening hyperthyroidism as well.  B. He ran out of MTZ in about February 2023.   3. David Campbell's last [facility] visit was on 03/12/21. After reviewing his lab results, I re-started him on MTZ, 10 mg, twice daily. He was supposed to return to clinic in 3 months, but was No Show for his  visit in June. Unfortunately, he did not start the MTZ because his pharmacy said there was no prescription.  A. In the interim he had been healthy until 07/22/21 when he presented to the ED at [facility] with feeling jittery and shaky. He was not taking any MTZ at that time. HR was 63. BP 139/80. He had an equivocal fine tremor of the left hand. He was not consuming a lot of caffeine or energy drinks. He had been working out in the heat for much of the day prior to developing symptoms.  He has since totally recovered.  B. He no longer has any shakiness or tremor.  C. He says that he has been eating somewhat healthier and eating less. He is doing a lot of his own cooking. He works out less..     4. Pertinent Review of Systems:  Constitutional: David Campbell feels "fine". His allergies have not been bothering him much so far this year. His energy level is "good". He has enough energy to do all the things he wants to do. He sleeps well. He no longer gets tired very often.  Eyes: Vision seems to be great. There are no recognized eye problems. He can look up and out without ay pressure symptoms.  Neck: As above. There are no recognized symptoms of the anterior neck.  Heart: As above. There are no recognized heart problems. The ability to perform his usual ADLs and do other physical activities seems normal.  Gastrointestinal: He says he has more hunger. Bowel movents seem normal. There are no other recognized GI problems. Hands: No tremor or other problems  Legs: Muscle mass and strength seem normal. He has not had any problems. No edema is noted.  Feet: There are no obvious foot problems. No edema is noted.  Neurologic: There are no recognized problems with muscle movement and strength, sensation, or coordination. Breasts: May be a little smaller  Skin: No pruritus  PAST MEDICAL, FAMILY, AND SOCIAL HISTORY  Past Medical History:  Diagnosis Date   ADHD (attention deficit hyperactivity disorder)     Asthma, chronic    Delayed linear growth    Environmental allergies    Graves disease    Hashimoto's disease    Tachycardia    Thyrotoxicosis with diffuse goiter     Family History  Problem Relation Age of Onset   Thyroid disease Mother        Mother had Graves' disease at age 86. She was treated with PTU for 4 years, then went into remission without medication.   Heart disease Maternal Grandmother    Thyroid disease Maternal Grandfather        Maternal grandfather had Graves' disease for which he was treated with partial thyroidectomy. When recurrence of Graves' disease occurred, he received high-131 therapy   Diabetes Paternal Grandmother      Current Outpatient Medications:    cetirizine (ZYRTEC) 10 MG tablet, Take 10 mg by mouth daily., Disp: , Rfl:    methimazole (TAPAZOLE) 10 MG tablet, Take 1 tablet (10 mg total) by mouth 2 (two) times daily., Disp: 60 tablet, Rfl: 5   fluticasone (FLONASE) 50 MCG/ACT nasal spray, Place 2 sprays into the nose daily. Reported on 03/05/2015 (Patient not taking: Reported on 12/28/2019), Disp: , Rfl:    methocarbamol (ROBAXIN) 500 MG tablet, Take 1 tablet (500 mg total) by mouth 2 (two) times daily. (Patient not taking: Reported on 12/28/2019), Disp: 20 tablet, Rfl: 0   PROVENTIL HFA 108 (90 Base) MCG/ACT inhaler, INHALE 2 PUFFS Q 4-6 H PRN (Patient not taking: Reported on 12/28/2019), Disp: , Rfl: 0  Allergies as of 07/28/2021 - Review Complete 07/28/2021  Allergen Reaction Noted   Food  10/19/2012     reports that he has never smoked. He has never been exposed to tobacco smoke. He has never used smokeless tobacco. He reports that he does not drink alcohol and does not use drugs.  1. School and family: He graduated from high school on June 5th 2020. 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 He lives with hs mother on [address] and works in [place]. Johnnette Barrios is a Holiday representative at [employer]. 2. Activities: He is doing more yard work.  3.  Primary Care Provider: none  REVIEW OF SYSTEMS: There are no other significant problems involving [name]'s other body systems.   Objective:  Vital Signs:  BP 122/80   Pulse 96   Wt 272 lb (123.4 kg)   BMI 35.89 kg/m    Ht Readings from Last 3 Encounters:  07/22/21 6\' 1"  (1.854 m)  03/12/21 6' 0.01" (1.829 m)  04/16/20 5' 11.54" (1.817 m)   Wt Readings from Last 3 Encounters:  07/28/21 272 lb (123.4 kg)  07/22/21 260 lb (117.9 kg)  03/12/21 267 lb (121.1 kg)   HC Readings from Last 3 Encounters:  No data found for [facility]   Body surface area is 2.52 meters squared.  Facility age limit for growth %iles is 20 years. Facility age limit for growth %iles is 20 years. Facility age limit for growth %iles is 20 years.  PHYSICAL  EXAM:  Constitutional: Joselyn Glassmanyler appears healthy, but more obese. He has gained 5 additional pounds since his last visit. His BMI is 35.89 kg/m2. He is clinically euthyroid. He is alert and bright. His affect and insight are normal. He is very Teacher, English as a foreign languagepersonable. I always enjoy seeing him.  Eyes: There is no arcus or proptosis.  Mouth: The oropharynx appears normal. The tongue appears normal. There is normal oral moisture. There is no obvious gingivitis. He has no tongue tremor.  Neck: There are no bruits present. His anterior neck appears enlarged. The thyroid gland is still enlarged, but smaller, at about 21 grams in size.  Today the lobes are symmetric.  The consistency of the thyroid gland is relatively full. There is no thyroid tenderness to palpation. Lungs: The lungs are clear. Air movement is good. Heart: The heart rhythm and rate appear normal. Heart sounds S1 and S2 are normal. I do not appreciate any pathologic heart murmurs. Abdomen: The abdomen is enlarged. Bowel sounds are normal. The abdomen is soft and non-tender. There is no obviously palpable hepatomegaly, splenomegaly, or other masses.  Arms: Muscle mass appears appropriate for age.  Hands: There is a no  tremor. Phalangeal and metacarpophalangeal joints appear normal. There is no palmar erythema.   Legs: Muscle mass appears appropriate for age. There is no edema.  Neurologic: Muscle strength is normal for age and gender  in both the upper and the lower extremities. Muscle tone appears normal. Sensation to touch is normal in the legs. Breast tissue: The breasts are fatty, but now only Tanner stage I; Areolae measured 40 mm on the right and 38 mm on the left, com[pared with 40 mm bilaterally at his last visit and with 45 mm on the right and 42 mm on the left at his prior visit.   LAB DATA: Results for orders placed or performed in visit on 07/28/21 (from the past 504 hour(s))  POCT Glucose (Device for Home Use)   Collection Time: 07/28/21  2:37 PM  Result Value Ref Range   Glucose Fasting, POC 82 70 - 99 mg/dL   POC Glucose    POCT glycosylated hemoglobin (Hb A1C)   Collection Time: 07/28/21  2:43 PM  Result Value Ref Range   Hemoglobin A1C 5.0 4.0 - 5.6 %   HbA1c POC (<> result, manual entry)     HbA1c, POC (prediabetic range)     HbA1c, POC (controlled diabetic range)    Results for orders placed or performed during the hospital encounter of 07/22/21 (from the past 504 hour(s))  CBG monitoring, ED   Collection Time: 07/22/21  6:53 PM  Result Value Ref Range   Glucose-Capillary 91 70 - 99 mg/dL   Comment 1 Notify RN    Labs 07/28/21: HbA1c 5.0%, CBG 82;  CBC pending, iron pending  Labs 03/12/21: CBG 94; TSH 0.53, free T4 1.3, free T3 4.3, TSI :131 (ref <140); CMP normal; CBC normal, except RBC 5.87 (ref 3.8-5.8), MCH 26.4 (ref 27-33)  Labs 04/16/20: HbA1c 5.1%, CBG 105  Labs 04/15/20: TSH 0.03, free T4 1.2, free T3 4.1; CMP normal; CBC normal, except RBC elevated at 5.99 (ref 4.20-5.80) and MCH low at 26.0 (ref 27-33);   Labs 12/28/19: HbA1c 5.2%, CBG 102  Labs 12/26/19: TSH 0.01, free T4 1.5, free T3 4.7, TSI pending  Labs 05/16/19: TSH <0.010  Labs 01/23/19: HbA1c 5.3%, CBG 109;  TSH 0.34, free T4 1.4, free T3 4.4, TSI 213; CMP normal; CBC normal, except his RBC count  was slightly elevated at 5.81 (ref 4.20-5.80)  Labs 09/05/08: TSH 0.28, free T4 1.5, free T3 4.2, TSI 178  Labs 02/02/18: HbA1c 5.5%, CBG 103  Labs 01/31/18: TSH 1.59, free T4 1.3, free T3 3.5, TSI 191  Labs 08/24/17: HbA1c 5.5%, CBG 81  Labs 08/19/17: TSH 1.66, free T4 1.4, free T3 3.8, TSI 189  Labs 04/12/17: HbA1c 5.5%, CBG 115  Labs 04/08/17: TSH 1.94, free T4 1.5, free T3 4.1, TSI 454  Labs 01/20/17: TSH 2.51, free T4 1.4, free T3 3.6, TSI 480  Labs 10/20/16: HbA1c 5.4%, CBG 113  Labs 09/19/16: TSH 2.58, free T4 1.4, free T3 3.8, TSI 213  Labs 06/22/16: HbA1c 5.3%, CBG 91  Labs 06/17/16: TSH 1.11, free T4 1.4, free T3 4.1, TSI 286 (ref <140, actually <100)  Labs 03/13/16: HbA1c 5.7%; TSH 1.41, free T4 1.2, free T3 3.5, TSI 349  Labs 12/16/15: HbA1c 5.7%  Labs 12/11/15: TSH 3.37, free T4 1.4, free T3 4.1  Labs 07/24/15: HbA1c 5.5%  Labs 05/29/15: TSH 0.23, free T4 1.2, free T3 3.7; TSI 477; CMP normal; CBC normal except for MCV 77.2, iron 106  Labs 04/11/15: HbA1c 5.4%; WBC 6.3, RBC 5.63 (normal 3.80-5.20), hemoglobin 14.3, hematocrit 42.4 %, MCV 75.3 (normal 77-95), neutrophils 3.0; CMP normal with AST 21 and ALT 27; TSH <0.01, free T4 1.4, free T3 4.8, TSI 481  Labs 03/05/15: CBG 125, HbA1c 5.2%  Labs 02/28/15: TSH <0.01, free T4 2.1, free T3 9.3, TSI 434; testosterone 217, estradiol 32  Labs 08/30/14: HbA1c 5.3%  Labs 08/24/14: TSH 1.756, free T4 1.40, free T3 4.1, TSI 135; testosterone 187, estradiol 27.8  Labs 05/15/14: TSH 1.247, free T4 1.18, free T3 4.3, TSI 120 (normal by lab is < 140); testosterone 234, estradiol 19.8; HbA1c 5.7%  Labs 12/21/13: HbA1c 5.4%, compared to 5.2% at last visit.  Labs 12/14/13:  TSH 1.063, free T4 1.02, free T3 4.1,TSI 123; testosterone 176, estradiol 13.9  Labs 07/04/13: TSH 1.056, free T4 1.22, free T3 4.5, TSI 60; testosterone 95, estradiol <  11.8  Labs 01/27/12: TSH 2.769, free T4 1.28, free T3 3.7; testosterone 54, estradiol < 11.8  Labs 09/08/12: [facility] can't find the lab results. Loney Loh has no record of any labs done since June.  Labs 07/01/12: TSH 1.424, free T4 1.33, free T3 3.8  Labs 02/23/12: TSH 1.999, free T4 1.26, free T3 3.7, IGF-1 pending, IGFBP-3 pending   Assessment and Plan:   ASSESSMENT:  1-2. Thyrotoxicosis with diffuse goiter, secondary to Graves disease/Hashimoto's thyroiditis.:   A. Makhai's Graves' disease and his Hashimoto's disease have been battling each other since 2006. At times his TSI and Graves' disease activity have been higher, causing higher TFTS and more signs and symptoms of Graves' disease and requiring higher doses of methimazole (MTZ) for treatment. At other times, however, he has had more signs and symptoms of Hashimoto's disease, his TFTs have been lower, and we have reduced his MTZ doses.   B. He was clinically nearly euthyroid in January 2021, but was still chemically mildly hyperthyroid. Unfortunately, he has since become more hyperthyroid and has needed increases in his MTZ dose. He needed a further increase in the MTZ dose in December 2021 to 20 mg, twice daily. He had also had some active thyroiditis recently prior to that December visit.   C. At his visit in April 2022, he felt good. His goiter was a bit smaller. His heart rate was normal. He did have a hand tremor and  palmar erythema. His free T4 and free T3 were lower and within normal limits. His TSH was still suppressed, but higher.   D. In March 2023 he had been without MTZ for about one month. He had a few hyperthyroid symptoms, a trace tremor, and 1-2+ palmar erythema. He appeared to be hyperthyroid, but probably less so than 11 months prior. Ironically, all three of his TFTs had increased in parallel, c/w a recent flare up of Hashimoto's thyroiditis. His TSH was low-normal, but the highest that it had been in years. E. Today in July  2023 he appears to be clinically euthyroid. F. The battle between his Graves' Dz B lymphocytes and his Hashimoto's Dz T lymphocytes continues. In all likelihood, the T cells will eventually win out. .  3. Prediabetes:   A. Since he had had three previous HbA1c values in the prediabetic range, we officially diagnosed him as having prediabetes. However, he then had five HbA1c values that were normal. Unfortunately, as he gained more weight his HbA1c values increased toward the prediabetic range. His January 2020 HbA1c level was again high-normal for his age.     B. It appears that since last visit Ahsan had been less serious about eating right and about exercising. He has gained 5 pounds. However, his breast tissue is less, which suggests that some of his weight gain is muscle.  His HbA1c remains normal. 4. Morbid obesity: His weight has increased more.   5. Large breasts/Gynecomastia: The glandular tissue has decreased over time. His areolae are a bit smaller.   6. Hypertension: His DBP is elevated today, paralleling his weight gain. He needs to do more cardio and lose more fat. He may need to start hypertensive medication at his next visit if the BP is not improved. 7-8. Tremor/palmar erythema: He has no tremor and no palmar erythema today, c/w being euthyroid.   PLAN:   1. Diagnostic: TFTs, TSI, CMP, CBC were performed earlier today.  2. Therapeutic:  Reduce caloric drinks. Eat Right. Exercise for an hour or more per day.  We will start BP medication as needed.  3. Patient education: Discussed his clinical exam and previous TFT results, increased weight, and overall thyroid hormone status. Continue to focus on reducing caloric drink intake, consuming less starch and sugar, and exercising for one hour per day or more.  4. Follow-up: 3-4 months with a new provider.   Level of Service: This visit lasted in excess of 65 minutes. More than 50% of the visit was devoted to counseling.  David Stall, MD, [facility]

## 2021-07-28 NOTE — Patient Instructions (Signed)
No further follow ups here. We will try to place him with a local adult endocrinologist.

## 2021-07-29 LAB — GLUCOSE, RANDOM: Glucose, Plasma: 80 mg/dL (ref 65–139)

## 2021-07-29 LAB — CBC WITH DIFFERENTIAL/PLATELET
Absolute Monocytes: 535 cells/uL (ref 200–950)
Basophils Absolute: 79 cells/uL (ref 0–200)
Basophils Relative: 1.2 %
Eosinophils Absolute: 317 cells/uL (ref 15–500)
Eosinophils Relative: 4.8 %
HCT: 47.4 % (ref 38.5–50.0)
Hemoglobin: 16 g/dL (ref 13.2–17.1)
Lymphs Abs: 2185 cells/uL (ref 850–3900)
MCH: 27.3 pg (ref 27.0–33.0)
MCHC: 33.8 g/dL (ref 32.0–36.0)
MCV: 80.7 fL (ref 80.0–100.0)
MPV: 11 fL (ref 7.5–12.5)
Monocytes Relative: 8.1 %
Neutro Abs: 3485 cells/uL (ref 1500–7800)
Neutrophils Relative %: 52.8 %
Platelets: 275 10*3/uL (ref 140–400)
RBC: 5.87 10*6/uL — ABNORMAL HIGH (ref 4.20–5.80)
RDW: 13.4 % (ref 11.0–15.0)
Total Lymphocyte: 33.1 %
WBC: 6.6 10*3/uL (ref 3.8–10.8)

## 2021-07-29 LAB — T4, FREE: Free T4: 1.2 ng/dL (ref 0.8–1.8)

## 2021-07-29 LAB — HEMOGLOBIN A1C
Hgb A1c MFr Bld: 5.3 % of total Hgb (ref <5.7)
Mean Plasma Glucose: 105 mg/dL
eAG (mmol/L): 5.8 mmol/L

## 2021-07-29 LAB — IRON: Iron: 115 ug/dL (ref 50–195)

## 2021-07-29 LAB — TSH: TSH: 1.36 mIU/L (ref 0.40–4.50)

## 2021-07-29 LAB — T3: T3, Total: 103 ng/dL (ref 76–181)

## 2021-08-11 ENCOUNTER — Telehealth (INDEPENDENT_AMBULATORY_CARE_PROVIDER_SITE_OTHER): Payer: Self-pay | Admitting: "Endocrinology

## 2021-08-11 NOTE — Telephone Encounter (Signed)
  Name of who is calling:[name]   Caller's Relationship to Patient:Self   Best contact [phone]  Provider they see:Dr.Brennan   Reason for call:Caller stated that they were told to call the office in two weeks after his labs were drawn for results and requested a call back. Please advise      PRESCRIPTION REFILL ONLY  Name of prescription:  Pharmacy:

## 2021-08-11 NOTE — Telephone Encounter (Signed)
Sent secure chat to provider for labs to be resulted.

## 2021-08-13 ENCOUNTER — Telehealth (INDEPENDENT_AMBULATORY_CARE_PROVIDER_SITE_OTHER): Payer: Self-pay

## 2021-08-13 NOTE — Telephone Encounter (Signed)
Called David Campbell to give results. No vm set up. Can not peak with mom. No DPR on file.

## 2021-08-13 NOTE — Telephone Encounter (Signed)
Called David Campbell to give results. Can not speak with mom there's no DPR on file.

## 2021-08-13 NOTE — Telephone Encounter (Signed)
-----   Message from Michael J Brennan, MD sent at 08/11/2021  3:16 PM EDT ----- Hemoglobin A1c was normal at 5.3%.  CBC is normal, except the RBC count is a bit elevated as it has been in the past. This is not a true clinical problem.  Iron is now higher, but still normal.  Thyroid tests were mid-normal.  Glucose was normal.  David Campbell does not need either methimazole or thyroid hormone at this time.  

## 2021-08-14 ENCOUNTER — Telehealth (INDEPENDENT_AMBULATORY_CARE_PROVIDER_SITE_OTHER): Payer: Self-pay

## 2021-08-14 ENCOUNTER — Encounter (INDEPENDENT_AMBULATORY_CARE_PROVIDER_SITE_OTHER): Payer: Self-pay

## 2021-08-14 NOTE — Telephone Encounter (Signed)
Letter sent.

## 2021-08-14 NOTE — Telephone Encounter (Signed)
-----   Message from Michael J Brennan, MD sent at 08/11/2021  3:16 PM EDT ----- Hemoglobin A1c was normal at 5.3%.  CBC is normal, except the RBC count is a bit elevated as it has been in the past. This is not a true clinical problem.  Iron is now higher, but still normal.  Thyroid tests were mid-normal.  Glucose was normal.  David Campbell does not need either methimazole or thyroid hormone at this time.  

## 2021-08-14 NOTE — Telephone Encounter (Signed)
-----   Message from David Stall, MD sent at 08/11/2021  3:16 PM EDT ----- Hemoglobin A1c was normal at 5.3%.  CBC is normal, except the RBC count is a bit elevated as it has been in the past. This is not a true clinical problem.  Iron is now higher, but still normal.  Thyroid tests were mid-normal.  Glucose was normal.  David Campbell does not need either methimazole or thyroid hormone at this time.

## 2021-08-14 NOTE — Telephone Encounter (Signed)
LVM on [facility] to return call.

## 2021-08-18 ENCOUNTER — Telehealth (INDEPENDENT_AMBULATORY_CARE_PROVIDER_SITE_OTHER): Payer: Self-pay | Admitting: "Endocrinology

## 2021-08-18 NOTE — Telephone Encounter (Signed)
Called. Spoke with Joselyn Glassman. Let him know since his mom isnt on his DPR we cant speak with her. Gave his results. Patient is happy.

## 2021-08-18 NOTE — Telephone Encounter (Signed)
  Name of who is calling: David Campbell Relationship to Patient: Self  Best contact number: [phone]  Provider they see: Dr. Ross Marcus  Reason for call: Patient calling for test results. Patient is requesting call back.      PRESCRIPTION REFILL ONLY  Name of prescription:  Pharmacy:

## 2021-11-18 IMAGING — CR DG CHEST 2V
2 series · 2 of 2 positions shown · non-contrast
Comparison: 01/27/2004

CLINICAL DATA: Right upper chest pain

EXAM:
CHEST - 2 VIEW

[w chest pa]
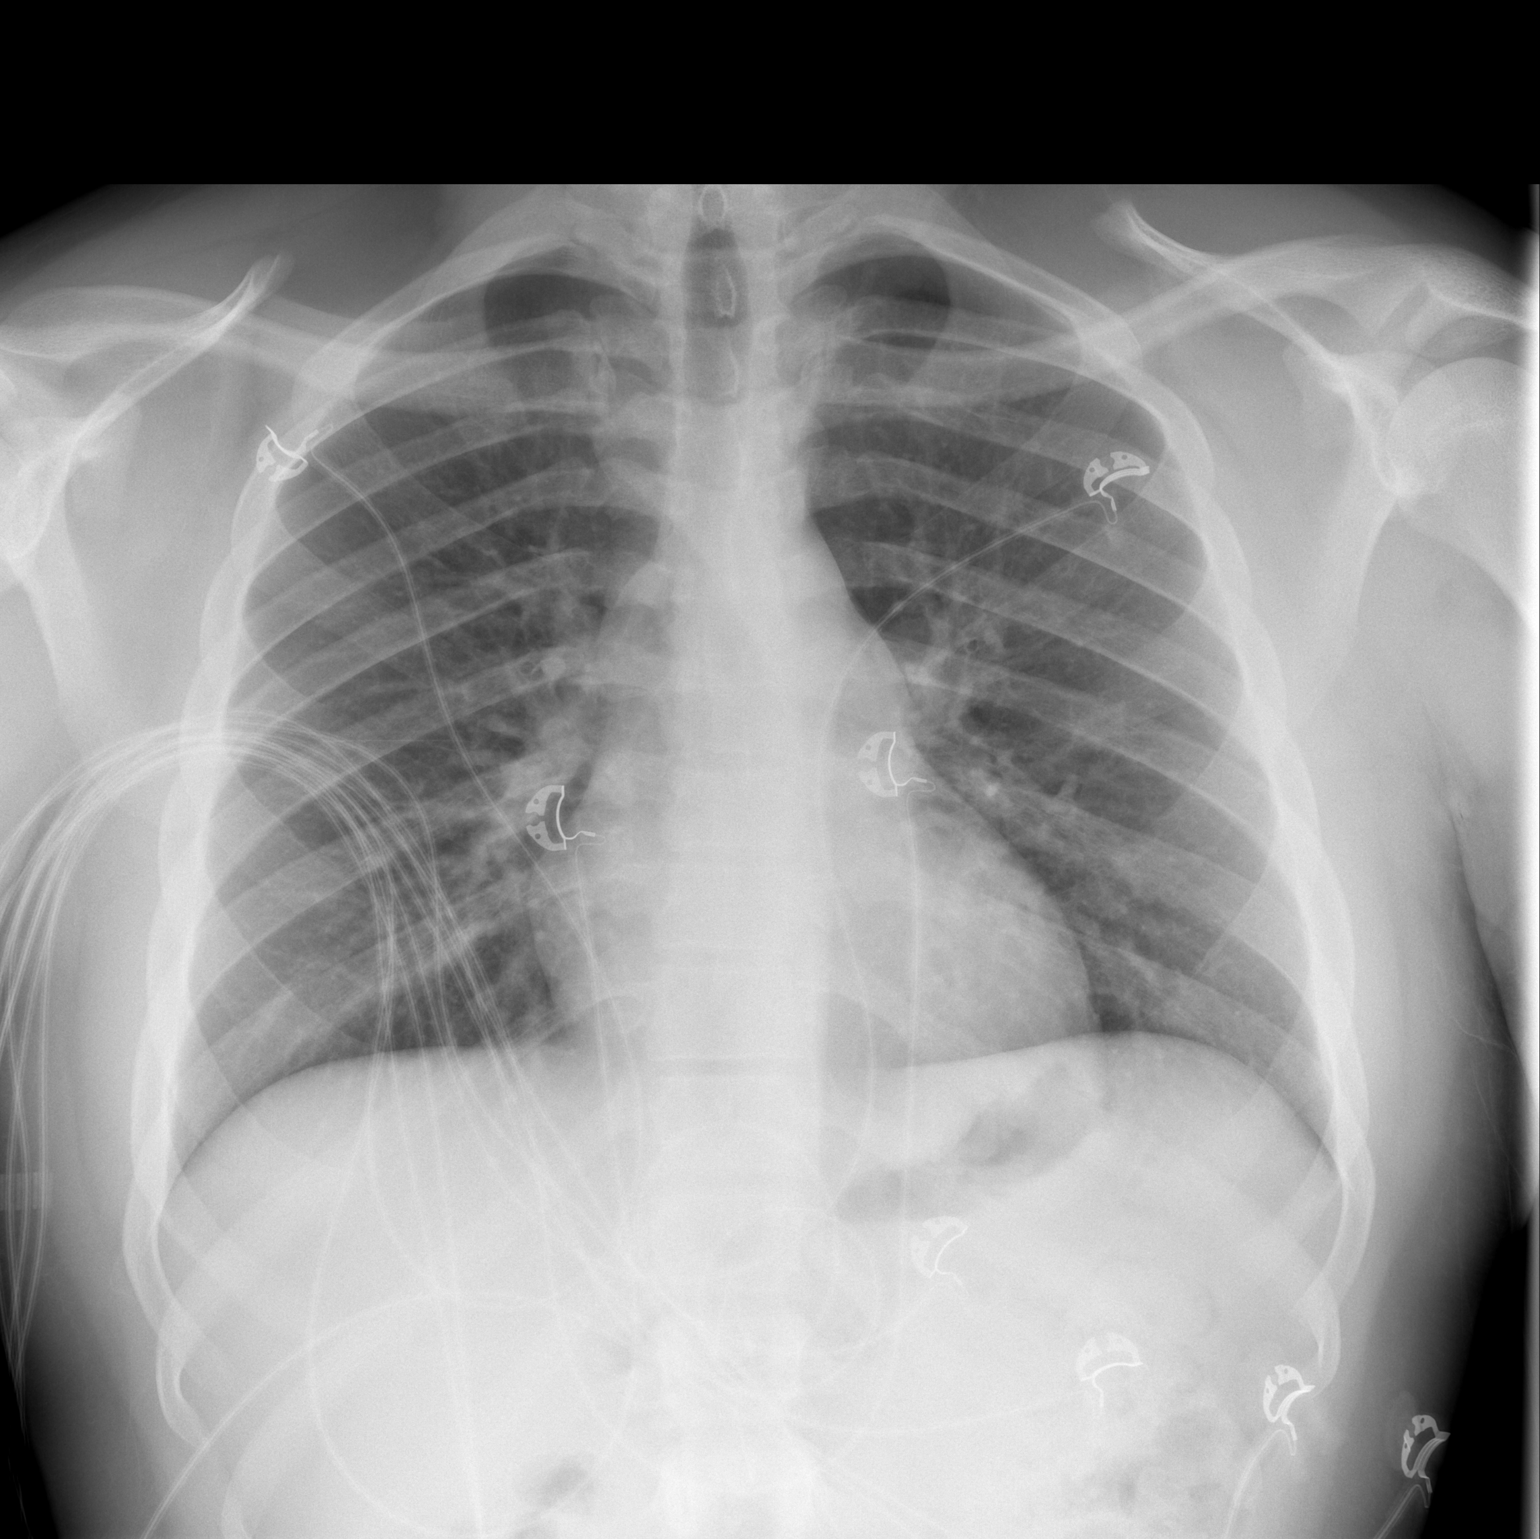

[w chest lat]
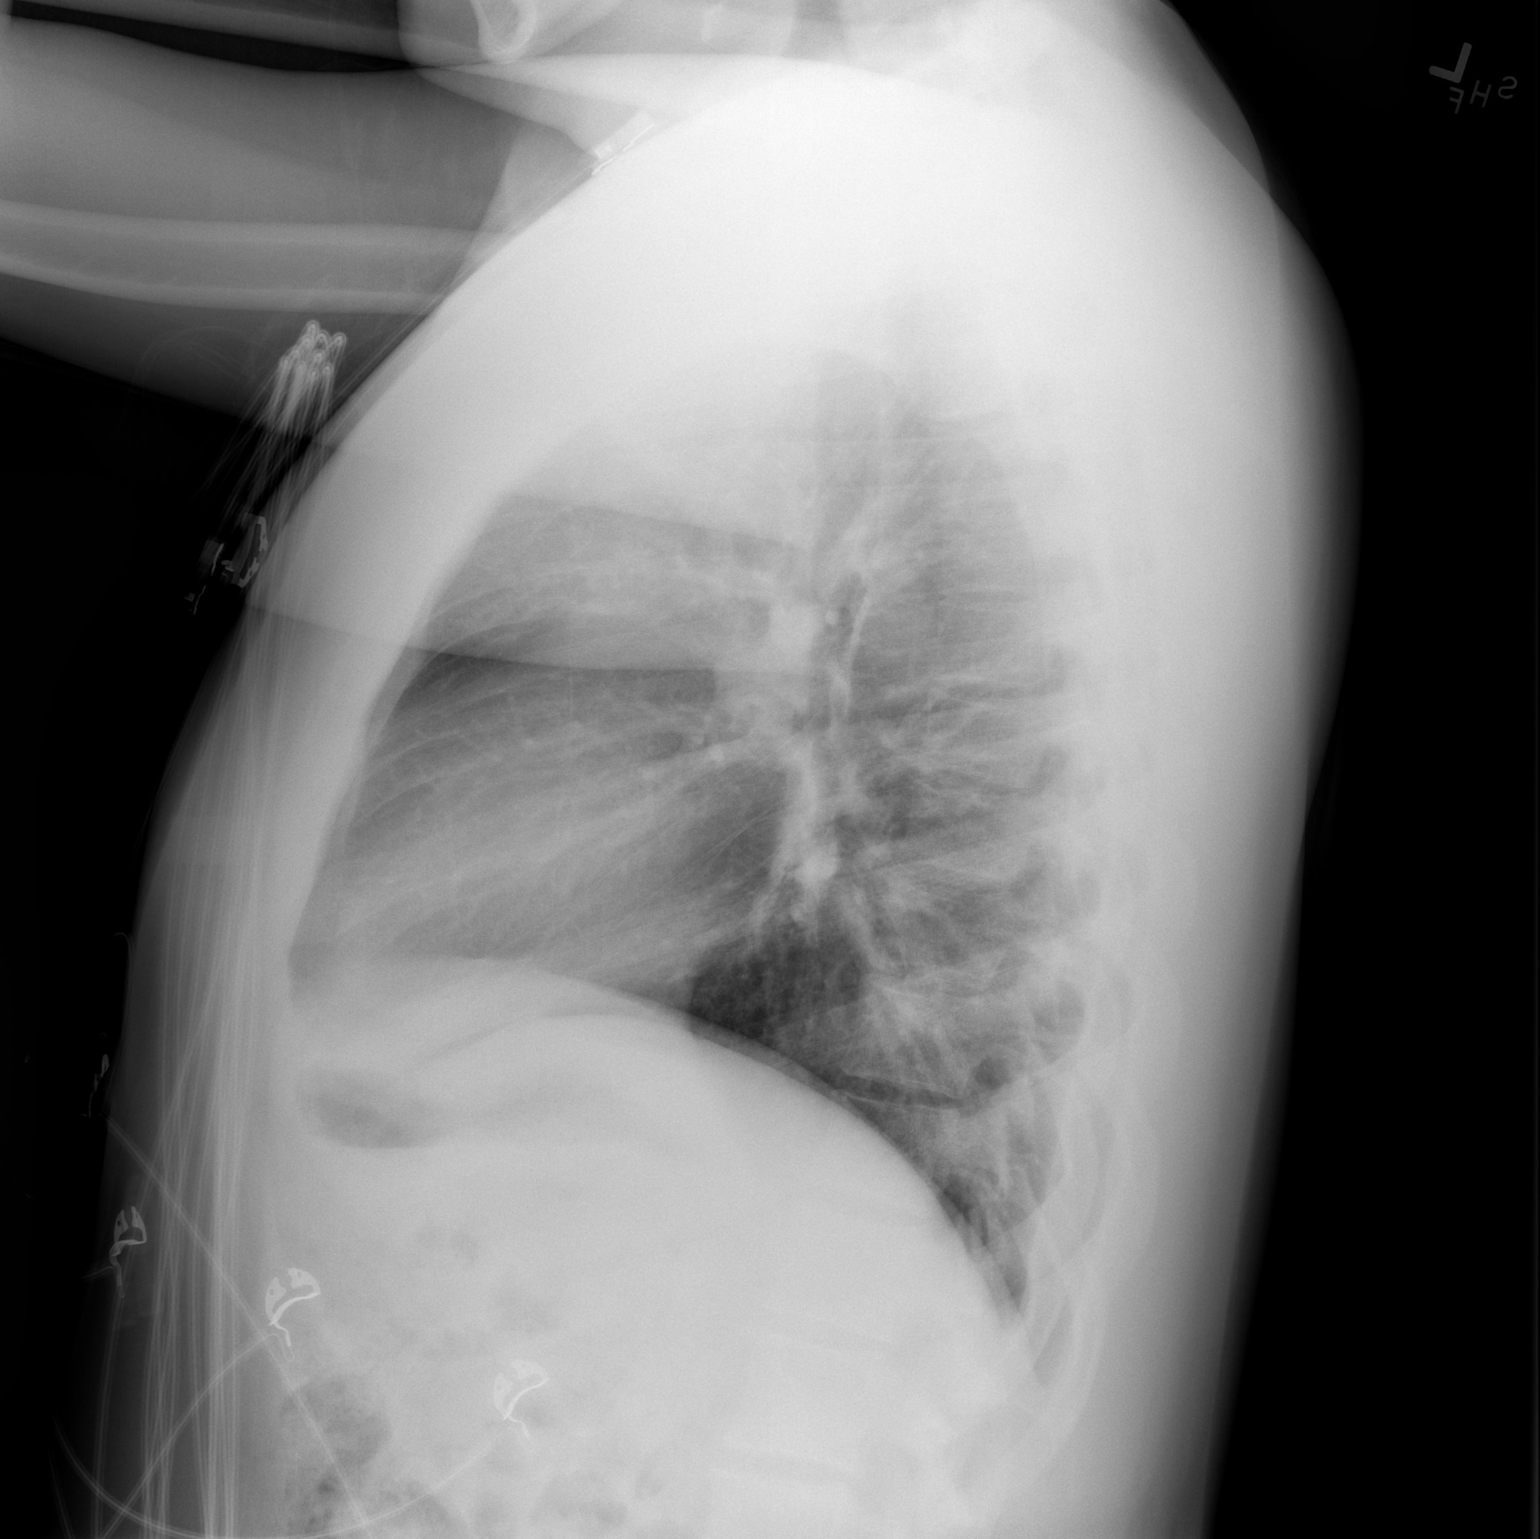

[2 of 2 positions shown; findings below may reference images not displayed]

FINDINGS: The heart size and mediastinal contours are within normal limits.
Both lungs are clear. The visualized skeletal structures are
unremarkable.
IMPRESSION: No active cardiopulmonary disease.

## 2022-04-07 DIAGNOSIS — Z1329 Encounter for screening for other suspected endocrine disorder: Secondary | ICD-10-CM | POA: Diagnosis not present

## 2022-04-07 DIAGNOSIS — Z136 Encounter for screening for cardiovascular disorders: Secondary | ICD-10-CM | POA: Diagnosis not present

## 2022-04-07 DIAGNOSIS — J302 Other seasonal allergic rhinitis: Secondary | ICD-10-CM | POA: Diagnosis not present

## 2022-04-07 DIAGNOSIS — Z0001 Encounter for general adult medical examination with abnormal findings: Secondary | ICD-10-CM | POA: Diagnosis not present

## 2022-04-07 DIAGNOSIS — Z8639 Personal history of other endocrine, nutritional and metabolic disease: Secondary | ICD-10-CM | POA: Diagnosis not present

## 2022-04-07 DIAGNOSIS — Z23 Encounter for immunization: Secondary | ICD-10-CM | POA: Diagnosis not present

## 2022-04-07 DIAGNOSIS — Z131 Encounter for screening for diabetes mellitus: Secondary | ICD-10-CM | POA: Diagnosis not present

## 2022-04-07 DIAGNOSIS — Z8709 Personal history of other diseases of the respiratory system: Secondary | ICD-10-CM | POA: Diagnosis not present

## 2022-05-27 DIAGNOSIS — Z20822 Contact with and (suspected) exposure to covid-19: Secondary | ICD-10-CM | POA: Diagnosis not present

## 2022-05-27 DIAGNOSIS — J209 Acute bronchitis, unspecified: Secondary | ICD-10-CM | POA: Diagnosis not present

## 2022-07-29 ENCOUNTER — Encounter (INDEPENDENT_AMBULATORY_CARE_PROVIDER_SITE_OTHER): Payer: BC Managed Care – PPO | Admitting: Internal Medicine

## 2022-07-29 DIAGNOSIS — Z0289 Encounter for other administrative examinations: Secondary | ICD-10-CM

## 2022-09-02 ENCOUNTER — Encounter (INDEPENDENT_AMBULATORY_CARE_PROVIDER_SITE_OTHER): Payer: Self-pay | Admitting: Family Medicine

## 2022-09-02 ENCOUNTER — Ambulatory Visit (INDEPENDENT_AMBULATORY_CARE_PROVIDER_SITE_OTHER): Payer: BC Managed Care – PPO | Admitting: Family Medicine

## 2022-09-02 VITALS — BP 137/82 | HR 74 | Temp 97.9°F | Ht 72.0 in | Wt 258.0 lb

## 2022-09-02 DIAGNOSIS — E669 Obesity, unspecified: Secondary | ICD-10-CM | POA: Diagnosis not present

## 2022-09-02 DIAGNOSIS — E079 Disorder of thyroid, unspecified: Secondary | ICD-10-CM

## 2022-09-02 DIAGNOSIS — E063 Autoimmune thyroiditis: Secondary | ICD-10-CM

## 2022-09-02 DIAGNOSIS — R5383 Other fatigue: Secondary | ICD-10-CM

## 2022-09-02 DIAGNOSIS — R0683 Snoring: Secondary | ICD-10-CM | POA: Diagnosis not present

## 2022-09-02 DIAGNOSIS — R7303 Prediabetes: Secondary | ICD-10-CM

## 2022-09-02 DIAGNOSIS — Z1331 Encounter for screening for depression: Secondary | ICD-10-CM

## 2022-09-02 DIAGNOSIS — J3089 Other allergic rhinitis: Secondary | ICD-10-CM | POA: Diagnosis not present

## 2022-09-02 DIAGNOSIS — Z6834 Body mass index (BMI) 34.0-34.9, adult: Secondary | ICD-10-CM

## 2022-09-02 DIAGNOSIS — G4710 Hypersomnia, unspecified: Secondary | ICD-10-CM

## 2022-09-02 DIAGNOSIS — R0602 Shortness of breath: Secondary | ICD-10-CM

## 2022-09-02 NOTE — Progress Notes (Signed)
[name], D.O.  ABFM, ABOM Specializing in Clinical Bariatric Medicine Office located at: [address]  [place], [place]  [address]     Bariatric Medicine Visit  Dear Loney Laurence, NP   Thank you for referring David Campbell to our clinic today for evaluation.  We performed a consultation to discuss his options for treatment and educate the patient on his disease state.  The following note includes my evaluation and treatment recommendations.   Please do not hesitate to reach out to me directly if you have any further concerns.   Assessment and Plan:  Review labs with pt next OV Orders Placed This Encounter  Procedures   CBC with Differential/Platelet   VITAMIN D 25 Hydroxy (Vit-D Deficiency, Fractures)   Comprehensive metabolic panel   Folate   Hemoglobin A1c   Insulin, random   Lipid Panel With LDL/HDL Ratio   T4, free   TSH   Vitamin B12   Ambulatory referral to Sleep Studies   EKG 12-Lead    Medications Discontinued During This Encounter  Medication Reason   PROVENTIL HFA 108 (90 Base) MCG/ACT inhaler    methocarbamol (ROBAXIN) 500 MG tablet    methimazole (TAPAZOLE) 10 MG tablet    fluticasone (FLONASE) 50 MCG/ACT nasal spray     Fatigue Assessment:  David Campbell does feel that his weight is causing his energy to be lower than it should be. Fatigue may be related to obesity, depression or many other causes. he does not appear to have any red flag symptoms and this appears to most likely be related to his current lifestyle habits and dietary intake.  Plan:  Labs will be ordered and reviewed with him at their next office visit in two weeks.  Epworth sleepiness scale score appears to be within normal limits.  His ESS score is 14.   David Campbell admits to daytime somnolence and  sometimes  wakes up still tired. Patient has a history of symptoms of morning headache. David Campbell generally gets 5 or 6 hours of sleep per night, and states that he has generally  restful sleep. Snoring is present. Apneic episodes are not present.   ECG: Performed and reviewed/ interpreted independently. Sinus bradycardia, rate 59bpm; reassuring without any acute abnormalities, will continue to monitor for symptoms   Modified PHQ-9 Depression Screen: His Food and Mood (modified PHQ-9) score was 1.  In the meanwhile, David Campbell will focus on self care including making healthy food choices by following their meal plan, improving sleep quality and focusing on stress reduction.  Once we are assured he is on an appropriate meal plan, we will start discussing exercise to increase cardiovascular fitness levels.    Shortness of breath on exertion Assessment:  [name] does feel that he gets out of breath more easily than he used to when he exercises and seems to be worsening over time with weight gain.  This has gotten worse recently. [name] denies shortness of breath at rest or orthopnea. [name]'s shortness of breath appears to be obesity related and exercise induced, as they do not appear to have any "red flag" symptoms/ concerns today.  Also, this condition appears to be related to a state of poor cardiovascular conditioning   Plan:  Obtain labs today and will be reviewed with him at their next office visit in two weeks.  Indirect Calorimeter completed today to help guide our dietary regimen. It shows a VO2 of 329 and a REE of 2275.  His calculated basal metabolic rate  is 2518 thus his measured basal metabolic rate is worse than expected.  Patient agreed to work on weight loss at this time.  As Omega progresses through our weight loss program, we will gradually increase exercise as tolerated to treat his current condition.   If David Campbell follows our recommendations and loses 5-10% of their weight without improvement of his shortness of breath or if at any time, symptoms become more concerning, they agree to urgently follow up with their PCP/ specialist for further consideration/ evaluation.    David Campbell verbalizes agreement with this plan.    Hashimoto's disease Assessment: Condition is Controlled. This is diet/exercise controlled. He was on thyroid medicines from the age [age] and has been off medication for about a year now. His thyroid levels continue to be monitored by his PCP. His thyroid levels have been controlled and he has no health complications involving this at this time.  Lab Results  Component Value Date   TSH 1.36 07/28/2021   FREET4 1.2 07/28/2021   Plan: - Start his prudent nutritional plan that is low in simple carbohydrates, saturated fats and trans fats to goal of 5-10% weight loss to achieve significant health benefits.  Pt encouraged to continually advance exercise and cardiovascular fitness as tolerated throughout weight loss journey.  - We will continue to monitor his thyroid levels alongside his PCP.    Snoring with excessive daytime sleepiness Assessment: Condition is Not optimized. His ESS is 14 and he admits to snoring during the night. He denies waking up gasping for air or stopped breathing while asleep. However, he has a high chance of dozing off during the day while doing activities or being sedentary.   Plan: - I will refer him to a sleep study specialist in [facility] to be evaluated.    Environmental and seasonal allergies Assessment: Condition is Controlled.[name] He informed me that he had asthma as a child which is now well controlled. He no longer carries an inhaler and takes Zertec daily.    Plan: - Continue Zertec 10mg  daily to control his seasonal allergies.    Prediabetes- diet controlled Assessment: Condition is Controlled. This is diet/exercise controlled. 9 years ago when he weighed around 275lbs his A1c was elevated at 5.8. His PCP informed him he was prediabetic but has not mentioned it ever since he lost weight and started eating healthier.  Lab Results  Component Value Date   HGBA1C 5.0 07/28/2021   HGBA1C 5.3 07/28/2021   HGBA1C 5.1  04/16/2020    Plan: - David Campbell will continue to work on weight loss, exercise, via their meal plan we devised to help decrease the risk of progressing to diabetes.   - Continue to decrease simple carbs/ sugars; increase fiber and proteins -> follow his meal plan.    - Anticipatory guidance given.    - We will recheck A1c and fasting insulin level in approximately 3 months from last check, or as deemed appropriate.    TREATMENT PLAN FOR OBESITY: Obesity (BMI 30.0-34.9)- starting BMI 09/02/22 34.98 BMI 34.0-34.9,adult Assessment: Muscle mass is  171lb. Fat mass is 78.8lb.Total body water is  122lb.   Plan:  David Campbell will work on healthier eating habits and try their best to follow the Category 4 meal plan with Breakfast and Lunch options with only 200 snack calories best they can.   Behavioral Intervention Additional resources provided today: category 4 meal plan information, breakfast options, and lunch options Evidence-based interventions for health behavior change were utilized today including the discussion  of self monitoring techniques, problem-solving barriers and SMART goal setting techniques.   Regarding patient's less desirable eating habits and patterns, we employed the technique of small changes.  Pt will specifically work on: Begin the category 4 meal plan for next visit.    FOLLOW UP: Follow up in 2 weeks. He was informed of the importance of frequent follow up visits to maximize his success with intensive lifestyle modifications for his multiple health conditions.  David Campbell is aware that we will review all of his lab results at our next visit.  He is aware that if anything is critical/ life threatening with the results, we will be contacting him via MyChart prior to the office visit to discuss management.    Chief Complaint:   OBESITY David Campbell (MR# [id number]) is a 22 y.o. male who presents for evaluation and treatment of obesity and related comorbidities.  Current BMI is Body mass index is 34.99 kg/m. Dakwon has been struggling with his weight for many years and has been unsuccessful in either losing weight, maintaining weight loss, or reaching his healthy weight goal.  David Campbell is currently in the action stage of change and ready to dedicate time achieving and maintaining a healthier weight. Yaro is interested in becoming our patient and working on intensive lifestyle modifications including (but not limited to) diet and exercise for weight loss.  Shamik Campbell works as a Buyer, retail and works 60hrs a week. Patient is single and does not have children. He lives with his mom, sister, grandmother, grandfather, and aunt.  Yusuf desires to be 223lbs ASAP and wants a healthier lifestyle, to decrease joint and muscle pain, enjoy life more, and have a better physical appearance. He feels as he gained weight due to poor food and workout choices. He currently exercises everyday for 1 hour. Conroy has tried the [employer] and lost 15lbs off this, he feels as a low carb-high protein diet works best for him. He eats out about 3-4 days a week and eats fast food about 3 times a week. He does like to cook and cooking in the house mainly consists of him, his mom, sister, or grandmother. They tend to eat separately. He denies craving foods and being a picky eater. He only dislikes anchovies. He snacks on applesauce and fruit and drinks soda regularly about 3 times a week, muscle milk, occasionally bourbon. Lehi tends to skip breakfast and lunch. He feels as his worst eating habit is starving all day and then eating at night. He feels as he struggles with portion control and can get uncomfortably full occasionally.    Subjective:   This is the patient's first visit at [facility].  The patient's NEW PATIENT PACKET that they filled out prior to today's office visit was reviewed at length and information from that paperwork was  included within the following office visit note.    Included in the packet: current and past health history, medications, allergies, ROS, gynecologic history (women only), surgical history, family history, social history, weight history, weight loss surgery history (for those that have had weight loss surgery), nutritional evaluation, mood and food questionnaire along with a depression screening (PHQ9) on all patients, an Epworth questionnaire, sleep habits questionnaire, patient life and health improvement goals questionnaire. These will all be scanned into the patient's chart under the "media" tab.   Review of Systems: Please refer to new patient packet scanned into media. Pertinent positives were addressed with patient today.  Reviewed by  clinician on day of visit: allergies, medications, problem list, medical history, surgical history, family history, social history, and previous encounter notes.  During the visit, I independently reviewed the patient's EKG, bioimpedance scale results, and indirect calorimeter results. I used this information to tailor a meal plan for the patient that will help David Campbell to lose weight and will improve his obesity-related conditions going forward.  I performed a medically necessary appropriate examination and/or evaluation. I discussed the assessment and treatment plan with the patient. The patient was provided an opportunity to ask questions and all were answered. The patient agreed with the plan and demonstrated an understanding of the instructions. Labs were ordered today (unless patient declined them) and will be reviewed with the patient at our next visit unless more critical results need to be addressed immediately. Clinical information was updated and documented in the EMR.   Objective:   PHYSICAL EXAM: Blood pressure 137/82, pulse 74, temperature 97.9 F (36.6 C), height 6' (1.829 m), weight 258 lb (117 kg), SpO2 98%. Body mass index is 34.99  kg/m. General: Well Developed, well nourished, and in no acute distress.  HEENT: Normocephalic, atraumatic Skin: Warm and dry, cap RF less 2 sec, good turgor Chest:  Normal excursion, shape, no gross abn Respiratory: speaking in full sentences, no conversational dyspnea NeuroM-Sk: Ambulates w/o assistance, moves * 4 Psych: A and O *3, insight good, mood-full  Anthropometric Measurements Height: 6' (1.829 m) Weight: 258 lb (117 kg) BMI (Calculated): 34.98 Weight at Last Visit: na Weight Lost Since Last Visit: na Weight Gained Since Last Visit: na Starting Weight: 258lb Total Weight Loss (lbs): 0 lb (0 kg) Peak Weight: 275lb Waist Measurement : 42 inches   Body Composition  Body Fat %: 30.5 % Fat Mass (lbs): 78.8 lbs Muscle Mass (lbs): 171 lbs Total Body Water (lbs): 122 lbs Visceral Fat Rating : 12   Other Clinical Data RMR: 2275 Fasting: yes Labs: yes Today's Visit #: 1 Starting Date: 09/02/22 Comments: first visit    DIAGNOSTIC DATA REVIEWED:  BMET    Component Value Date/Time   NA 137 03/12/2021 1411   K 4.3 03/12/2021 1411   CL 105 03/12/2021 1411   CO2 23 03/12/2021 1411   GLUCOSE 83 03/12/2021 1411   BUN 16 03/12/2021 1411   CREATININE 1.00 03/12/2021 1411   CALCIUM 9.4 03/12/2021 1411   GFRNONAA >60 05/16/2019 1936   GFRAA >60 05/16/2019 1936   Lab Results  Component Value Date   HGBA1C 5.0 07/28/2021   HGBA1C 5.8 (H) 07/04/2013   No results found for: "INSULIN" Lab Results  Component Value Date   TSH 1.36 07/28/2021   CBC    Component Value Date/Time   WBC 6.6 07/28/2021 1342   RBC 5.87 (H) 07/28/2021 1342   HGB 16.0 07/28/2021 1342   HCT 47.4 07/28/2021 1342   PLT 275 07/28/2021 1342   MCV 80.7 07/28/2021 1342   MCH 27.3 07/28/2021 1342   MCHC 33.8 07/28/2021 1342   RDW 13.4 07/28/2021 1342   Iron Studies    Component Value Date/Time   IRON 115 07/28/2021 1342   Lipid Panel  No results found for: "CHOL", "TRIG", "HDL",  "CHOLHDL", "VLDL", "LDLCALC", "LDLDIRECT" Hepatic Function Panel     Component Value Date/Time   PROT 7.0 03/12/2021 1411   ALBUMIN 4.2 05/29/2015 1708   AST 17 03/12/2021 1411   ALT 22 03/12/2021 1411   ALKPHOS 227 05/29/2015 1708   BILITOT 0.9 03/12/2021 1411  Component Value Date/Time   TSH 1.36 07/28/2021 1342   Nutritional No results found for: "VD25OH"  Attestation Statements:   I, Clinical biochemist, acting as a Stage manager for [employer], DO., have compiled all relevant documentation for today's office visit on behalf of Thomasene Lot, DO, while in the presence of [employer], DO.  Time spent on visit including pre-visit chart review and post-visit care was estimated to be 60 minutes. Over 50% of the time was spent in direct face to face counseling and coordination of care.  I have reviewed the above documentation for accuracy and completeness, and I agree with the above. [name], D.O.  The 21st Century Cures Act was signed into law in 2016 which includes the topic of electronic health records.  This provides immediate access to information in MyChart.  This includes consultation notes, operative notes, office notes, lab results and pathology reports.  If you have any questions about what you read please let us know at your next visit so we can discuss your concerns and take corrective action if need be.  We are right here with you.

## 2022-09-03 LAB — CBC WITH DIFFERENTIAL/PLATELET
Basophils Absolute: 0.1 10*3/uL (ref 0.0–0.2)
Basos: 1 %
EOS (ABSOLUTE): 0.4 10*3/uL (ref 0.0–0.4)
Eos: 7 %
Hematocrit: 49.2 % (ref 37.5–51.0)
Hemoglobin: 16 g/dL (ref 13.0–17.7)
Immature Grans (Abs): 0 10*3/uL (ref 0.0–0.1)
Immature Granulocytes: 0 %
Lymphocytes Absolute: 1.7 10*3/uL (ref 0.7–3.1)
Lymphs: 29 %
MCH: 26.3 pg — ABNORMAL LOW (ref 26.6–33.0)
MCHC: 32.5 g/dL (ref 31.5–35.7)
MCV: 81 fL (ref 79–97)
Monocytes Absolute: 0.4 10*3/uL (ref 0.1–0.9)
Monocytes: 7 %
Neutrophils Absolute: 3.4 10*3/uL (ref 1.4–7.0)
Neutrophils: 56 %
Platelets: 267 10*3/uL (ref 150–450)
RBC: 6.08 x10E6/uL — ABNORMAL HIGH (ref 4.14–5.80)
RDW: 13.2 % (ref 11.6–15.4)
WBC: 6 10*3/uL (ref 3.4–10.8)

## 2022-09-03 LAB — TSH: TSH: 0.806 u[IU]/mL (ref 0.450–4.500)

## 2022-09-03 LAB — HEMOGLOBIN A1C
Est. average glucose Bld gHb Est-mCnc: 114 mg/dL
Hgb A1c MFr Bld: 5.6 % (ref 4.8–5.6)

## 2022-09-03 LAB — INSULIN, RANDOM: INSULIN: 19.4 u[IU]/mL (ref 2.6–24.9)

## 2022-09-03 LAB — VITAMIN B12: Vitamin B-12: 828 pg/mL (ref 232–1245)

## 2022-09-03 LAB — LIPID PANEL WITH LDL/HDL RATIO
Cholesterol, Total: 159 mg/dL (ref 100–199)
HDL: 52 mg/dL (ref >39)
LDL Chol Calc (NIH): 97 mg/dL (ref 0–99)
LDL/HDL Ratio: 1.9 ratio (ref 0.0–3.6)
Triglycerides: 48 mg/dL (ref 0–149)
VLDL Cholesterol Cal: 10 mg/dL (ref 5–40)

## 2022-09-03 LAB — COMPREHENSIVE METABOLIC PANEL
ALT: 25 IU/L (ref 0–44)
AST: 21 IU/L (ref 0–40)
Albumin: 4.4 g/dL (ref 4.3–5.2)
Alkaline Phosphatase: 62 IU/L (ref 44–121)
BUN/Creatinine Ratio: 17 (ref 9–20)
BUN: 16 mg/dL (ref 6–20)
Bilirubin Total: 0.6 mg/dL (ref 0.0–1.2)
CO2: 23 mmol/L (ref 20–29)
Calcium: 9.3 mg/dL (ref 8.7–10.2)
Chloride: 101 mmol/L (ref 96–106)
Creatinine, Ser: 0.95 mg/dL (ref 0.76–1.27)
Globulin, Total: 2.6 g/dL (ref 1.5–4.5)
Glucose: 95 mg/dL (ref 70–99)
Potassium: 4.7 mmol/L (ref 3.5–5.2)
Sodium: 137 mmol/L (ref 134–144)
Total Protein: 7 g/dL (ref 6.0–8.5)
eGFR: 116 mL/min/{1.73_m2} (ref >59)

## 2022-09-03 LAB — T4, FREE: Free T4: 1.2 ng/dL (ref 0.82–1.77)

## 2022-09-03 LAB — VITAMIN D 25 HYDROXY (VIT D DEFICIENCY, FRACTURES): Vit D, 25-Hydroxy: 26.5 ng/mL — ABNORMAL LOW (ref 30.0–100.0)

## 2022-09-03 LAB — FOLATE: Folate: 12.2 ng/mL (ref >3.0)

## 2022-09-22 ENCOUNTER — Ambulatory Visit (INDEPENDENT_AMBULATORY_CARE_PROVIDER_SITE_OTHER): Payer: BC Managed Care – PPO | Admitting: Family Medicine

## 2022-10-07 ENCOUNTER — Encounter (INDEPENDENT_AMBULATORY_CARE_PROVIDER_SITE_OTHER): Payer: Self-pay | Admitting: Family Medicine

## 2022-10-07 ENCOUNTER — Ambulatory Visit (INDEPENDENT_AMBULATORY_CARE_PROVIDER_SITE_OTHER): Payer: BC Managed Care – PPO | Admitting: Family Medicine

## 2022-10-07 VITALS — BP 140/82 | HR 82 | Temp 98.0°F | Ht 72.0 in | Wt 261.0 lb

## 2022-10-07 DIAGNOSIS — E0789 Other specified disorders of thyroid: Secondary | ICD-10-CM

## 2022-10-07 DIAGNOSIS — E559 Vitamin D deficiency, unspecified: Secondary | ICD-10-CM

## 2022-10-07 DIAGNOSIS — Z6835 Body mass index (BMI) 35.0-35.9, adult: Secondary | ICD-10-CM

## 2022-10-07 DIAGNOSIS — J3089 Other allergic rhinitis: Secondary | ICD-10-CM

## 2022-10-07 DIAGNOSIS — E669 Obesity, unspecified: Secondary | ICD-10-CM

## 2022-10-07 DIAGNOSIS — R7303 Prediabetes: Secondary | ICD-10-CM | POA: Diagnosis not present

## 2022-10-07 DIAGNOSIS — E079 Disorder of thyroid, unspecified: Secondary | ICD-10-CM

## 2022-10-07 MED ORDER — VITAMIN D (ERGOCALCIFEROL) 1.25 MG (50000 UNIT) PO CAPS
50000.0000 [IU] | ORAL_CAPSULE | ORAL | 0 refills | Status: AC
Start: 2022-10-07 — End: ?

## 2022-10-07 NOTE — Progress Notes (Signed)
David Campbell, D.O.  ABFM, ABOM Clinical Bariatric Medicine Physician  Office located at: [address] [place], [place]  [address]     Assessment and Plan:   Meds ordered this encounter  Medications   Vitamin D, Ergocalciferol, (DRISDOL) 1.25 MG (50000 UNIT) CAPS capsule    Sig: Take 1 capsule (50,000 Units total) by mouth every 7 (seven) days.    Dispense:  4 capsule    Refill:  0     Prediabetes- diet controlled Assessment & Plan: Lab Results  Component Value Date   HGBA1C 5.6 09/02/2022   HGBA1C 5.0 07/28/2021   HGBA1C 5.3 07/28/2021   INSULIN 19.4 09/02/2022    He is managing his prediabetes with diet and exercise. We reviewed his latest labs, A1C was elevated at 5.6 and insulin elevated at 19.4 as of 09/02/22. Informed him of A1C goal of <5 to avoid progression of his condition, David Campbell verbalized understanding. Educated Pt on the importance of insulin levels as it pertains to weight loss.  Multiple resources were given during this visit in an extensive effort to help Pt with quick and easy healthy food options. This also included discussing the advantages of ChatGPT for healthy recipe ideas. He noted he only occasionally drinks alcohol due to a strong family history of alcoholism.  I advised David Campbell to freeze meals to help with meal prepping throughout the week. Encouraged him to try to use his day off (Sunday) to meal prep/plan and use a slow cooker. I recommend weighing proteins and measuring portions for every meal. Continue with prudent nutritional meal plan as advised.    Thyroid disease- age 22; mixture of hypo and hyperthyroidism Assessment & Plan: Lab Results  Component Value Date   TSH 0.806 09/02/2022   Pt has positive hx of Hashimoto's when he was a child. He is currently not treating his hypothyroidism with any medications. After reviewing his last TSH of 0.806 on 09/02/22, I informed Pt of this level being in the low normal range. Ideal TSH level range  was discussed. We will continue to monitor his TSH levels as it pertains to weight loss. Continue on current regimen    Environmental and seasonal allergies Assessment & Plan: Condition is overall stable and well controlled. He reports no significant concerns or questions at this time. Continue current regimen. Will monitor his condition as it pertains to his weight loss journey.    Vitamin D deficiency Assessment & Plan: Lab Results  Component Value Date   VD25OH 26.5 (L) 09/02/2022    Reviewed latest vitamin D levels obtained on 09/02/22, at 26.5. Discussed goal levels of 50-70, Pt verbalized understanding. I explained the benefits of vitamin D and how level increase is expected with weight loss. Educated Pt on fat-soluble vitamins and how vitamin D is more difficult to obtain naturally in foods. We discussed the option of starting vitamin D supplements, which Pt agreed to. We will continue to monitor his vitamin D levels. Will start Pt on ERGO 50K daily today.    Obesity (BMI 30.0-34.9)- starting BMI 09/02/22 34.98 BMI 35.0-35.9,adult-current 35.39 Assessment & Plan: David Campbell is here to discuss his progress with his obesity treatment plan along with follow-up of his obesity related diagnoses. See Medical Weight Management Flowsheet for complete bioelectrical impedance results.  Condition is docourse: worsening.   Since last office visit patient's muscle mass has increased by 2.2lb. Fat mass has increased by 0.8 lb. Total body water has increased by 4.4lb.  Counseling  done on how various foods will affect these numbers and how to maximize success  Total lbs lost to date: -3 lbs Total weight loss percentage to date: 1.16%   Continue to follow the Category 4 plan with 200 snack calories best they can.   No change to meal plan - see Subjective   Behavioral Intervention Additional resources provided today: Recipes, recipe packet #1, and slow cooker meal options  handout Evidence-based interventions for health behavior change were utilized today including the discussion of self monitoring techniques, problem-solving barriers and SMART goal setting techniques.   Regarding patient's less desirable eating habits and patterns, we employed the technique of small changes.  Pt will specifically work on: Meal prepping/planning on Sundays, using slow cooker for easier prep for next visit.   FOLLOW UP: Return in about 6 days (around 10/13/2022). He was informed of the importance of frequent follow up visits to maximize his success with intensive lifestyle modifications for his multiple health conditions.  Subjective:   Chief complaint: Obesity David Campbell is here to discuss his progress with his obesity treatment plan. He is on the Category 4 Plan with 200 snack calories and states he is following his eating plan approximately 25 % of the time. He states he is exercising doing cardio 45-60 minutes 6 days per week and weights 40 minutes 2-3 days a week.   Interval History:  David Campbell is here today for his first follow-up office visit since starting the program with [place].  Since last office visit he has struggled to follow his meal plan. Was feeling full and satisfied with no hunger or cravings and was eating enough protein on the first week when on plan. When he got busy on the 2nd and 3rd week, he found it very difficult to eat on plan with limited time to cook/pack meals for lunch/dinner. 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 He would often come home and skip dinner due to exhaustion, forgetting to pack a lunch. This resulted in him quickly grabbing yogurt, cheese, and lunch meat to eat for lunch. Doesn't keep "junk food" in his fridge. Was eating chicken salads with greek yogurt first week. Ended up not using much of the snack calories - states he is not much of a snacker. Eats fruits such as nectarines,  bananas, and golden kiwis for snacks. He drinks 64 oz bottle of water from 7 am-1 pm and the second bottle from 1:30-5:30 pm. The rest of the day he drinks water casually.   All blood work/ lab tests that were recently ordered by myself or an outside provider were reviewed with patient today per their request. Extended time was spent counseling him on all new disease processes that were discovered or preexisting ones that are affected by BMI.  he understands that many of these abnormalities will need to monitored regularly along with the current treatment plan of prudent dietary changes, in which we are making each and every office visit, to improve these health parameters.  Review of Systems:  Pertinent positives were addressed with patient today.  Reviewed by clinician on day of visit: allergies, medications, problem list, medical history, surgical history, family history, social history, and previous encounter notes.  Weight Summary and Biometrics   Weight Lost Since Last Visit: 0lb  Weight Gained Since Last Visit: 3lb   Vitals Temp: 98 F (36.7 C) BP: (!) 140/82 Pulse Rate: 82 SpO2: 98 %  Anthropometric Measurements Height: 6' (1.829 m) Weight: 261 lb (118.4 kg) BMI (Calculated): 35.39 Weight at Last Visit: 258lb Weight Lost Since Last Visit: 0lb Weight Gained Since Last Visit: 3lb Starting Weight: 258lb Total Weight Loss (lbs): 0 lb (0 kg) Peak Weight: 275lb   Body Composition  Body Fat %: 30.4 % Fat Mass (lbs): 79.6 lbs Muscle Mass (lbs): 173.2 lbs Total Body Water (lbs): 126.4 lbs Visceral Fat Rating : 12   Other Clinical Data Fasting: no Labs: no Today's Visit #: 2 Starting Date: 09/02/22     Objective:   PHYSICAL EXAM:  Blood pressure (!) 140/82, pulse 82, temperature 98 F (36.7 C), height 6' (1.829 m), weight 261 lb (118.4 kg), SpO2 98%. Body mass index is 35.4 kg/m.  General: Well Developed, well nourished, and in no acute distress.  HEENT:  Normocephalic, atraumatic Skin: Warm and dry, cap RF less 2 sec, good turgor Chest:  Normal excursion, shape, no gross abn Respiratory: speaking in full sentences, no conversational dyspnea NeuroM-Sk: Ambulates w/o assistance, moves * 4 Psych: A and O *3, insight good, mood-full  DIAGNOSTIC DATA REVIEWED:  BMET    Component Value Date/Time   NA 137 09/02/2022 0933   K 4.7 09/02/2022 0933   CL 101 09/02/2022 0933   CO2 23 09/02/2022 0933   GLUCOSE 95 09/02/2022 0933   GLUCOSE 83 03/12/2021 1411   BUN 16 09/02/2022 0933   CREATININE 0.95 09/02/2022 0933   CREATININE 1.00 03/12/2021 1411   CALCIUM 9.3 09/02/2022 0933   GFRNONAA >60 05/16/2019 1936   GFRAA >60 05/16/2019 1936   Lab Results  Component Value Date   HGBA1C 5.6 09/02/2022   HGBA1C 5.8 (H) 07/04/2013   Lab Results  Component Value Date   INSULIN 19.4 09/02/2022   Lab Results  Component Value Date   TSH 0.806 09/02/2022   CBC    Component Value Date/Time   WBC 6.0 09/02/2022 0933   WBC 6.6 07/28/2021 1342   RBC 6.08 (H) 09/02/2022 0933   RBC 5.87 (H) 07/28/2021 1342   HGB 16.0 09/02/2022 0933   HCT 49.2 09/02/2022 0933   PLT 267 09/02/2022 0933   MCV 81 09/02/2022 0933   MCH 26.3 (L) 09/02/2022 0933   MCH 27.3 07/28/2021 1342   MCHC 32.5 09/02/2022 0933   MCHC 33.8 07/28/2021 1342   RDW 13.2 09/02/2022 0933   Iron Studies    Component Value Date/Time   IRON 115 07/28/2021 1342   Lipid Panel     Component Value Date/Time   CHOL 159 09/02/2022 0933   TRIG 48 09/02/2022 0933   HDL 52 09/02/2022 0933   LDLCALC 97 09/02/2022 0933   Hepatic Function Panel     Component Value Date/Time   PROT 7.0 09/02/2022 0933   ALBUMIN 4.4 09/02/2022 0933   AST 21 09/02/2022 0933   ALT 25 09/02/2022 0933   ALKPHOS 62 09/02/2022 0933   BILITOT 0.6 09/02/2022 0933      Component Value Date/Time   TSH 0.806 09/02/2022 0933   Nutritional Lab Results  Component Value Date   VD25OH 26.5 (L) 09/02/2022     Attestations:   Reviewed by clinician on day of visit: allergies, medications, problem list, medical history, surgical history, family history, social history, and previous encounter notes.   Patient was in the office today and time spent on visit including pre-visit chart review and post-visit care/coordination of care and electronic medical record documentation was 61 minutes. 50% of the time was  in face to face counseling of this patient's medical condition(s) and providing education on treatment options to include the first-line treatment of diet and lifestyle modification.  I, Isabelle Course, acting as a medical scribe for Thomasene Lot, DO., have compiled all relevant documentation for today's office visit on behalf of Thomasene Lot, DO, while in the presence of [employer], DO.  I have reviewed the above documentation for accuracy and completeness, and I agree with the above. David Campbell, D.O.  The 21st Century Cures Act was signed into law in 2016 which includes the topic of electronic health records.  This provides immediate access to information in MyChart.  This includes consultation notes, operative notes, office notes, lab results and pathology reports.  If you have any questions about what you read please let us know at your next visit so we can discuss your concerns and take corrective action if need be.  We are right here with you.

## 2022-10-13 ENCOUNTER — Ambulatory Visit (INDEPENDENT_AMBULATORY_CARE_PROVIDER_SITE_OTHER): Payer: BC Managed Care – PPO | Admitting: Family Medicine

## 2022-10-14 ENCOUNTER — Ambulatory Visit (INDEPENDENT_AMBULATORY_CARE_PROVIDER_SITE_OTHER): Payer: BC Managed Care – PPO | Admitting: Neurology

## 2022-10-14 ENCOUNTER — Encounter: Payer: Self-pay | Admitting: Neurology

## 2022-10-14 VITALS — BP 135/75 | HR 71 | Ht 72.0 in | Wt 267.0 lb

## 2022-10-14 DIAGNOSIS — G4719 Other hypersomnia: Secondary | ICD-10-CM

## 2022-10-14 DIAGNOSIS — Z9189 Other specified personal risk factors, not elsewhere classified: Secondary | ICD-10-CM

## 2022-10-14 DIAGNOSIS — R0683 Snoring: Secondary | ICD-10-CM | POA: Diagnosis not present

## 2022-10-14 DIAGNOSIS — E669 Obesity, unspecified: Secondary | ICD-10-CM

## 2022-10-14 DIAGNOSIS — R519 Headache, unspecified: Secondary | ICD-10-CM | POA: Diagnosis not present

## 2022-10-14 DIAGNOSIS — Z820 Family history of epilepsy and other diseases of the nervous system: Secondary | ICD-10-CM

## 2022-10-14 NOTE — Patient Instructions (Signed)

## 2022-10-14 NOTE — Progress Notes (Signed)
Subjective:    Patient ID: David Campbell is a 22 y.o. male.  HPI    David Foley, MD, PhD [facility] [address], [address] [address] [place], [place] [address]  Dear Dr. Sharee Holster,  I saw your patient, David Campbell, upon your kind request in my sleep clinic today for initial consultation of his sleep disorder, in particular, concern for underlying obstructive sleep apnea.  The patient is unaccompanied today.  As you know, David Campbell is a 22 year old male with an underlying medical history of thyroid disease with history of thyrotoxicosis and goiter (Graves' disease and  Hashimoto's disease per chart review), allergies, prediabetes, ADHD (not on medications, tried as a child), asthma (not on meds), history of tachycardia, and obesity, who reports snoring and excessive daytime somnolence.  His Epworth sleepiness score is 14 out of 24, fatigue severity score is 23 out of 63.  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.  He generally works from 7 AM to 5:30 PM.  Bedtime is typically around 11 or 11:30 PM and rise time around 5:30 AM.  He lives with his maternal grandparents, mom, maternal aunt and cousin.  His maternal grandparents have sleep apnea and so does his dad.  He does not endorse nightly nocturia but has had occasional morning headaches.  He is working on weight loss.  He does not drink caffeine daily.  He does not drink any alcohol or rarely.  He is a non-smoker.  They have 2 dogs in the household.  He does not have a TV in his bedroom.  I reviewed your office note from 09/02/2022.  His Past Medical History Is Significant For: Past Medical History:  Diagnosis Date   ADHD (attention deficit hyperactivity disorder)    Asthma, chronic    Delayed linear growth    Environmental allergies    Graves disease    Hashimoto's disease    Tachycardia    Thyrotoxicosis with diffuse goiter     His Past Surgical  History Is Significant For: Past Surgical History:  Procedure Laterality Date   None      His Family History Is Significant For: Family History  Problem Relation Age of Onset   Thyroid disease Mother        Mother had Graves' disease at age 71. She was treated with PTU for 4 years, then went into remission without medication.   Diabetes Father    Heart disease Maternal Grandmother    Thyroid disease Maternal Grandfather        Maternal grandfather had Graves' disease for which he was treated with partial thyroidectomy. When recurrence of Graves' disease occurred, he received high-131 therapy   Diabetes Paternal Grandmother     His Social History Is Significant For: Social History   Socioeconomic History   Marital status: Single    Spouse name: Not on file   Number of children: Not on file   Years of education: Not on file   Highest education level: Not on file  Occupational History   Not on file  Tobacco Use   Smoking status: Never    Passive exposure: Never   Smokeless tobacco: Never  Vaping Use   Vaping status: Never Used  Substance and Sexual Activity   Alcohol use: No   Drug use: No   Sexual activity: Never  Other Topics Concern   Not on file  Social History Narrative   Buyer, 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  Caffiene none, rare soda   Social Determinants of Health   Financial Resource Strain: Not on file  Food Insecurity: Not on file  Transportation Needs: Not on file  Physical Activity: Not on file  Stress: Not on file  Social Connections: Not on file    His Allergies Are:  Allergies  Allergen Reactions   Food     Grapes, cherries, mango, pineapple  :   His Current Medications Are:  Outpatient Encounter Medications as of 10/14/2022  Medication Sig   aspirin-acetaminophen-caffeine (EXCEDRIN MIGRAINE) 250-250-65 MG tablet Take 2 tablets by mouth every 6 (six) hours as needed for headache.   cetirizine (ZYRTEC) 10 MG tablet Take 10 mg by mouth  daily.   Vitamin D, Ergocalciferol, (DRISDOL) 1.25 MG (50000 UNIT) CAPS capsule Take 1 capsule (50,000 Units total) by mouth every 7 (seven) days.   No facility-administered encounter medications on file as of 10/14/2022.  :   Review of Systems:  Out of a complete 14 point review of systems, all are reviewed and negative with the exception of these symptoms as listed below:   Review of Systems  Neurological:        Internal referral. Snoring, Am headaches. ESS 14  FSS 23.    Objective:  Neurological Exam  Physical Exam Physical Examination:   Vitals:   10/14/22 1022  BP: 135/75  Pulse: 71    General Examination: The patient is a very pleasant 22 y.o. male in no acute distress. He appears well-developed and well-nourished and well groomed.   HEENT: Normocephalic, atraumatic, pupils are equal, round and reactive to light, extraocular tracking is good without limitation to gaze excursion or nystagmus noted. Hearing is grossly intact. Face is symmetric with normal facial animation. Speech is clear with no dysarthria noted. There is no hypophonia. There is no lip, neck/head, jaw or voice tremor. Neck is supple with full range of passive and active motion. There are no carotid bruits on auscultation. Oropharynx exam reveals: mild mouth dryness, good dental hygiene and mild airway crowding, due to tonsillar size of about 1+ and slightly wider uvula noted.  Tongue protrudes centrally and palate elevates symmetrically.  Mallampati class II.  Neck circumference 16-5/8 inches.  Minimal overbite noted.  Chest: Clear to auscultation without wheezing, rhonchi or crackles noted.  Heart: S1+S2+0, regular and normal without murmurs, rubs or gallops noted.   Abdomen: Soft, non-tender and non-distended.  Extremities: There is no pitting edema in the distal lower extremities bilaterally.   Skin: Warm and dry without trophic changes noted.   Musculoskeletal: exam reveals no obvious joint  deformities.   Neurologically:  Mental status: The patient is awake, alert and oriented in all 4 spheres. His immediate and remote memory, attention, language skills and fund of knowledge are appropriate. There is no evidence of aphasia, agnosia, apraxia or anomia. Speech is clear with normal prosody and enunciation. Thought process is linear. Mood is normal and affect is normal.  Cranial nerves II - XII are as described above under HEENT exam.  Motor exam: Normal bulk, strength and tone is noted. There is no obvious action or resting tremor.  Fine motor skills and coordination: grossly intact.  Cerebellar testing: No dysmetria or intention tremor. There is no truncal or gait ataxia.  Sensory exam: intact to light touch in the upper and lower extremities.  Gait, station and balance: He stands easily. No veering to one side is noted. No leaning to one side is noted. Posture is age-appropriate and stance  is narrow based. Gait shows normal stride length and normal pace. No problems turning are noted.   Assessment and Plan:  In summary, Ellsworth Waldschmidt is a very pleasant 22 y.o.-year old male with an underlying medical history of thyroid disease with history of thyrotoxicosis and goiter (Graves' disease and  Hashimoto's disease per chart review), allergies, prediabetes, ADHD (not on medications, tried as a child), asthma (not on meds), history of tachycardia, and obesity, whose history and physical exam are concerning for sleep disordered breathing, particularly obstructive sleep apnea (OSA).  While a laboratory attended sleep study is typically considered "gold standard" for evaluation of sleep disordered, we mutually agreed to proceed with a home sleep test at this time breathing.   I had a long chat with the patient about my findings and the diagnosis of sleep apnea, particularly OSA, its prognosis and treatment options. We talked about medical/conservative treatments, surgical interventions and  non-pharmacological approaches for symptom control. I explained, in particular, the risks and ramifications of untreated moderate to severe OSA, especially with respect to developing cardiovascular disease down the road, including congestive heart failure (CHF), difficult to treat hypertension, cardiac arrhythmias (particularly A-fib), neurovascular complications including TIA, stroke and dementia. Even type 2 diabetes has, in part, been linked to untreated OSA. Symptoms of untreated OSA may include (but may not be limited to) daytime sleepiness, nocturia (i.e. frequent nighttime urination), memory problems, mood irritability and suboptimally controlled or worsening mood disorder such as depression and/or anxiety, lack of energy, lack of motivation, physical discomfort, as well as recurrent headaches, especially morning or nocturnal headaches. We talked about the importance of maintaining a healthy lifestyle and striving for healthy weight. In addition, we talked about the importance of striving for and maintaining good sleep hygiene. I recommended a sleep study at this time. I outlined the differences between a laboratory attended sleep study which is considered more comprehensive and accurate over the option of a home sleep test (HST); the latter may lead to underestimation of sleep disordered breathing in some instances and does not help with diagnosing upper airway resistance syndrome and is not accurate enough to diagnose primary central sleep apnea typically. I outlined possible surgical and non-surgical treatment options of OSA, including the use of a positive airway pressure (PAP) device (i.e. CPAP, AutoPAP/APAP or BiPAP in certain circumstances), a custom-made dental device (aka oral appliance, which would require a referral to a specialist dentist or orthodontist typically, and is generally speaking not considered for patients with full dentures or edentulous state), upper airway surgical options, such  as traditional UPPP (which is not considered a first-line treatment) or the Inspire device (hypoglossal nerve stimulator, which would involve a referral for consultation with an ENT surgeon, after careful selection, following inclusion criteria - also not first-line treatment). I explained the PAP treatment option to the patient in detail, as this is generally considered first-line treatment.  The patient indicated that he would be willing to try PAP therapy, if the need arises. I explained the importance of being compliant with PAP treatment, not only for insurance purposes but primarily to improve patient's symptoms symptoms, and for the patient's long term health benefit, including to reduce His cardiovascular risks longer-term.    We will pick up our discussion about the next steps and treatment options after testing.  We will keep him posted as to the test results by phone call and/or MyChart messaging where possible.  We will plan to follow-up in sleep clinic accordingly as well.  I  answered all his questions today and the patient was in agreement.   I encouraged him to call with any interim questions, concerns, problems or updates or email [place] through MyChart.  Generally speaking, sleep test authorizations may take up to 2 weeks, sometimes less, sometimes longer, the patient is encouraged to get in touch with [place] if they do not hear back from the sleep lab staff directly within the next 2 weeks.  Thank you very much for allowing me to participate in the care of this nice patient. If I can be of any further assistance to you please do not hesitate to call me at [phone].  Sincerely,   David Foley, MD, PhD

## 2022-10-28 ENCOUNTER — Telehealth: Payer: Self-pay | Admitting: Neurology

## 2022-10-28 NOTE — Telephone Encounter (Signed)
HST- BCBS no Berkley Harvey req spoke to [place] ref # [id number], BCBS fed no auth req & MCD [facility] pending faxed notes

## 2022-11-05 ENCOUNTER — Ambulatory Visit (INDEPENDENT_AMBULATORY_CARE_PROVIDER_SITE_OTHER): Payer: BC Managed Care – PPO | Admitting: Family Medicine

## 2022-11-11 NOTE — Telephone Encounter (Signed)
HST BCBS no Berkley Harvey req spoke to [place] ref # [id number] & MCD Abbie Sons: [id number] (exp. 11/11/22 to 02/09/23)

## 2023-03-05 DIAGNOSIS — R0981 Nasal congestion: Secondary | ICD-10-CM | POA: Diagnosis not present

## 2023-03-05 DIAGNOSIS — H66002 Acute suppurative otitis media without spontaneous rupture of ear drum, left ear: Secondary | ICD-10-CM | POA: Diagnosis not present

## 2023-03-05 DIAGNOSIS — Z20822 Contact with and (suspected) exposure to covid-19: Secondary | ICD-10-CM | POA: Diagnosis not present

## 2023-03-05 DIAGNOSIS — R059 Cough, unspecified: Secondary | ICD-10-CM | POA: Diagnosis not present

## 2023-03-15 DIAGNOSIS — J209 Acute bronchitis, unspecified: Secondary | ICD-10-CM | POA: Diagnosis not present

## 2023-03-15 DIAGNOSIS — R051 Acute cough: Secondary | ICD-10-CM | POA: Diagnosis not present

## 2023-04-29 DIAGNOSIS — R070 Pain in throat: Secondary | ICD-10-CM | POA: Diagnosis not present

## 2023-04-29 DIAGNOSIS — J029 Acute pharyngitis, unspecified: Secondary | ICD-10-CM | POA: Diagnosis not present

## 2023-05-24 DIAGNOSIS — M25561 Pain in right knee: Secondary | ICD-10-CM | POA: Diagnosis not present

## 2023-10-15 DIAGNOSIS — R030 Elevated blood-pressure reading, without diagnosis of hypertension: Secondary | ICD-10-CM | POA: Diagnosis not present

## 2023-10-15 DIAGNOSIS — R519 Headache, unspecified: Secondary | ICD-10-CM | POA: Diagnosis not present
